# Patient Record
Sex: Male | Born: 1990 | Race: Black or African American | Hispanic: No | Marital: Single | Smoking: Current every day smoker
Health system: Southern US, Community
[De-identification: ages and names within clinical notes are randomized; demographics above are authoritative.]

## PROBLEM LIST (undated history)

## (undated) ENCOUNTER — Ambulatory Visit (HOSPITAL_COMMUNITY): Admission: EM | Payer: Self-pay

## (undated) DIAGNOSIS — B20 Human immunodeficiency virus [HIV] disease: Secondary | ICD-10-CM

## (undated) DIAGNOSIS — Q2730 Arteriovenous malformation, site unspecified: Secondary | ICD-10-CM

## (undated) DIAGNOSIS — F200 Paranoid schizophrenia: Secondary | ICD-10-CM

## (undated) DIAGNOSIS — Z21 Asymptomatic human immunodeficiency virus [HIV] infection status: Secondary | ICD-10-CM

## (undated) DIAGNOSIS — F1910 Other psychoactive substance abuse, uncomplicated: Secondary | ICD-10-CM

---
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
## 2010-11-04 ENCOUNTER — Emergency Department (HOSPITAL_COMMUNITY): Payer: Self-pay

## 2010-11-04 ENCOUNTER — Emergency Department (HOSPITAL_COMMUNITY)
Admission: EM | Admit: 2010-11-04 | Discharge: 2010-11-04 | Disposition: A | Discharge status: Home / Self Care | Payer: Self-pay | Attending: Emergency Medicine | Admitting: Emergency Medicine

## 2010-11-04 DIAGNOSIS — R141 Gas pain: Secondary | ICD-10-CM | POA: Insufficient documentation

## 2010-11-04 DIAGNOSIS — K5900 Constipation, unspecified: Secondary | ICD-10-CM | POA: Insufficient documentation

## 2010-11-04 DIAGNOSIS — R1013 Epigastric pain: Secondary | ICD-10-CM | POA: Insufficient documentation

## 2010-11-04 DIAGNOSIS — R142 Eructation: Secondary | ICD-10-CM | POA: Insufficient documentation

## 2011-05-02 LAB — RAPID STREP SCREEN (MED CTR MEBANE ONLY): Streptococcus, Group A Screen (Direct): NEGATIVE

## 2011-08-22 ENCOUNTER — Encounter (HOSPITAL_COMMUNITY): Payer: Self-pay | Admitting: *Deleted

## 2011-08-22 ENCOUNTER — Emergency Department (HOSPITAL_COMMUNITY)
Admission: EM | Admit: 2011-08-22 | Discharge: 2011-08-22 | Disposition: A | Discharge status: Home / Self Care | Payer: Self-pay | Attending: Emergency Medicine | Admitting: Emergency Medicine

## 2011-08-22 DIAGNOSIS — N342 Other urethritis: Secondary | ICD-10-CM | POA: Insufficient documentation

## 2011-08-22 DIAGNOSIS — R369 Urethral discharge, unspecified: Secondary | ICD-10-CM | POA: Insufficient documentation

## 2011-08-22 MED ORDER — AZITHROMYCIN 250 MG PO TABS
1000.0000 mg | ORAL_TABLET | Freq: Once | ORAL | Status: AC
  Administered 2011-08-22: 1000 mg via ORAL
  Filled 2011-08-22: qty 4

## 2011-08-22 MED ORDER — CEFTRIAXONE SODIUM 250 MG IJ SOLR
250.0000 mg | Freq: Once | INTRAMUSCULAR | Status: AC
  Administered 2011-08-22: 250 mg via INTRAMUSCULAR
  Filled 2011-08-22: qty 250

## 2011-08-22 MED ORDER — LIDOCAINE HCL 1 % IJ SOLN
INTRAMUSCULAR | Status: AC
  Administered 2011-08-22: 20 mL
  Filled 2011-08-22: qty 20

## 2011-08-22 NOTE — ED Provider Notes (Signed)
History     CSN: [id number]  Arrival date & time 08/22/11  8295   First MD Initiated Contact with Patient 08/22/11 2037      Chief Complaint  Patient presents with  . SEXUALLY TRANSMITTED DISEASE    white penile d/c x 3 days w/dysuria    (Consider location/radiation/quality/duration/timing/severity/associated sxs/prior treatment) Patient is a 21 y.o. male presenting with penile discharge. The history is provided by the patient.  Penile Discharge This is a new problem. The current episode started in the past 7 days. The problem occurs constantly. The problem has been unchanged. Pertinent negatives include no abdominal pain, chills, fever, nausea, rash or vomiting. Associated symptoms comments: Positive dysuria. The symptoms are aggravated by nothing. He has tried nothing for the symptoms.  Unprotected anal intercourse with 1 male partner recently, unknown STD status. Has appt at STD clinic next week.  History reviewed. No pertinent past medical history.  History reviewed. No pertinent past surgical history.  History reviewed. No pertinent family history.  History  Substance Use Topics  . Smoking status: Current Everyday Smoker    Types: Cigarettes  . Smokeless tobacco: Not on file  . Alcohol Use: Yes     twice a week      Review of Systems  Constitutional: Negative for fever and chills.  Gastrointestinal: Negative for nausea, vomiting and abdominal pain.  Genitourinary: Positive for dysuria and discharge. Negative for penile swelling and penile pain.  Skin: Negative for rash.    Allergies  Review of patient's allergies indicates no known allergies.  Home Medications  No current outpatient prescriptions on file.  BP 109/76  Pulse 65  Temp(Src) 98.9 F (37.2 C) (Oral)  Resp 16  SpO2 100%  Physical Exam  Nursing note and vitals reviewed. Constitutional: He is oriented to person, place, and time. He appears well-developed and well-nourished. No distress.  HENT:   Head: Normocephalic and atraumatic.  Right Ear: External ear normal.  Left Ear: External ear normal.  Mouth/Throat: Oropharynx is clear and moist.  Eyes: Pupils are equal, round, and reactive to light.  Neck: Normal range of motion. Neck supple.  Cardiovascular: Normal rate and regular rhythm.   Pulmonary/Chest:       Normal respiratory effort and excursion  Abdominal: Soft. He exhibits no distension. There is no tenderness.  Genitourinary: Testes normal. Circumcised. No penile tenderness. Discharge found.       Yellow penile discharge  Musculoskeletal: He exhibits no edema and no tenderness.  Neurological: He is alert and oriented to person, place, and time.  Skin: Skin is warm and dry. No rash noted.  Psychiatric: He has a normal mood and affect.    ED Course  Procedures (including critical care time)   Labs Reviewed  GC/CHLAMYDIA PROBE AMP, GENITAL   No results found.  Dx 1: Urethritis    MDM  Tx with rocephin and azithromycin, high suspicion for gonorrhea +/- chlamydia. Advised to have syphilis and HIV testing from STD clinic.        Elwyn Reach [place], [place] 08/22/11 2150

## 2011-08-23 NOTE — ED Provider Notes (Signed)
Medical screening examination/treatment/procedure(s) were performed by non-physician practitioner and as supervising physician I was immediately available for consultation/collaboration.  Doug Sou, MD 08/23/11 [phone]

## 2011-08-25 LAB — GC/CHLAMYDIA PROBE AMP, GENITAL
Chlamydia, DNA Probe: POSITIVE — AB
GC Probe Amp, Genital: POSITIVE — AB

## 2011-08-28 ENCOUNTER — Telehealth (HOSPITAL_COMMUNITY): Payer: Self-pay | Admitting: *Deleted

## 2011-08-29 ENCOUNTER — Telehealth (HOSPITAL_COMMUNITY): Payer: Self-pay | Admitting: *Deleted

## 2011-08-30 ENCOUNTER — Telehealth (HOSPITAL_COMMUNITY): Payer: Self-pay | Admitting: Emergency Medicine

## 2011-08-30 NOTE — ED Notes (Signed)
Several attempts to contact patient; letter sent to Epic address

## 2011-09-01 NOTE — ED Notes (Signed)
Letter sent to Epic address 1/21 °

## 2011-11-05 ENCOUNTER — Emergency Department (INDEPENDENT_AMBULATORY_CARE_PROVIDER_SITE_OTHER)
Admission: EM | Admit: 2011-11-05 | Discharge: 2011-11-05 | Disposition: A | Discharge status: Home / Self Care | Payer: Self-pay | Source: Home / Self Care | Attending: Family Medicine | Admitting: Family Medicine

## 2011-11-05 ENCOUNTER — Encounter (HOSPITAL_COMMUNITY): Payer: Self-pay

## 2011-11-05 DIAGNOSIS — K2990 Gastroduodenitis, unspecified, without bleeding: Secondary | ICD-10-CM

## 2011-11-05 DIAGNOSIS — K2970 Gastritis, unspecified, without bleeding: Secondary | ICD-10-CM

## 2011-11-05 MED ORDER — RANITIDINE HCL 150 MG PO TABS: 150.0000 mg | ORAL_TABLET | Freq: Two times a day (BID) | ORAL | Status: AC

## 2011-11-05 NOTE — ED Provider Notes (Signed)
History     CSN: [id number]  Arrival date & time 11/05/11  4098   First MD Initiated Contact with Patient 11/05/11 2001      Chief Complaint  Patient presents with  . Abdominal Pain    (Consider location/radiation/quality/duration/timing/severity/associated sxs/prior treatment) HPI Comments: Benjamin Simpson presents for evaluation of lower abdominal pain, intermittently, over the last month. He reports some intermittent epigastric pain as well, that seems exacerbated with certain foods, such as spicy. He denies any hx of GERD or reflux disease. He also noted some blood when he wiped earlier today.  Patient is a 21 y.o. male presenting with abdominal pain. The history is provided by the patient.  Abdominal Pain The primary symptoms of the illness include abdominal pain. The primary symptoms of the illness do not include nausea or vomiting. The current episode started more than 2 days ago. The problem has not changed since onset. The patient has not had a change in bowel habit. Additional symptoms associated with the illness include heartburn.    History reviewed. No pertinent past medical history.  History reviewed. No pertinent past surgical history.  History reviewed. No pertinent family history.  History  Substance Use Topics  . Smoking status: Current Everyday Smoker    Types: Cigarettes  . Smokeless tobacco: Not on file  . Alcohol Use: Yes     twice a week      Review of Systems  Constitutional: Negative.   HENT: Negative.   Eyes: Negative.   Respiratory: Negative.   Cardiovascular: Negative.   Gastrointestinal: Positive for heartburn and abdominal pain. Negative for nausea and vomiting.  Genitourinary: Negative.   Musculoskeletal: Negative.   Skin: Negative.   Neurological: Negative.     Allergies  Review of patient's allergies indicates no known allergies.  Home Medications   Current Outpatient Rx  Name Route Sig Dispense Refill  . RANITIDINE HCL 150 MG PO TABS  Oral Take 1 tablet (150 mg total) by mouth 2 (two) times daily. 30 tablet 0    BP 118/72  Pulse 54  Temp(Src) 98.2 F (36.8 C) (Oral)  Resp 10  SpO2 100%  Physical Exam  Nursing note and vitals reviewed. Constitutional: He is oriented to person, place, and time. He appears well-developed and well-nourished.  HENT:  Head: Normocephalic and atraumatic.  Eyes: EOM are normal.  Neck: Normal range of motion.  Pulmonary/Chest: Effort normal.  Abdominal: Soft. Normal appearance and bowel sounds are normal. There is no tenderness.  Genitourinary: Guaiac negative stool.  Musculoskeletal: Normal range of motion.  Neurological: He is alert and oriented to person, place, and time.  Skin: Skin is warm and dry.  Psychiatric: His behavior is normal.    ED Course  Procedures (including critical care time)  Labs Reviewed - No data to display No results found.   1. Gastritis       MDM  Given rx for ranitidine trial; advised diet modification        Renaee Munda, MD 12/03/11 2123

## 2011-11-05 NOTE — Discharge Instructions (Signed)
Take the medication as directed. Modify diet to reduce amount of acidic products, dairy products, spicy or fried foods. Consume more fruits and vegetables and fiber in your diet. Return to care should your symptoms not improve, or worsen in any way, such as increased abdominal pain, you are unable to tolerate food or drink by mouth, persistent vomiting, or any other symptom that might concern you.

## 2011-11-05 NOTE — ED Notes (Signed)
Pain and pressure in abdominal area for 1 month or more; concerned about multiple issues, but had blood in stool earlier today; NAD

## 2012-03-02 IMAGING — CR DG ABDOMEN ACUTE W/ 1V CHEST
3 series · 3 of 3 positions shown · non-contrast
Comparison: None.

CLINICAL DATA: Abdominal pain.

ACUTE ABDOMEN SERIES (ABDOMEN 2 VIEW & CHEST 1 VIEW)

[w chest pa]
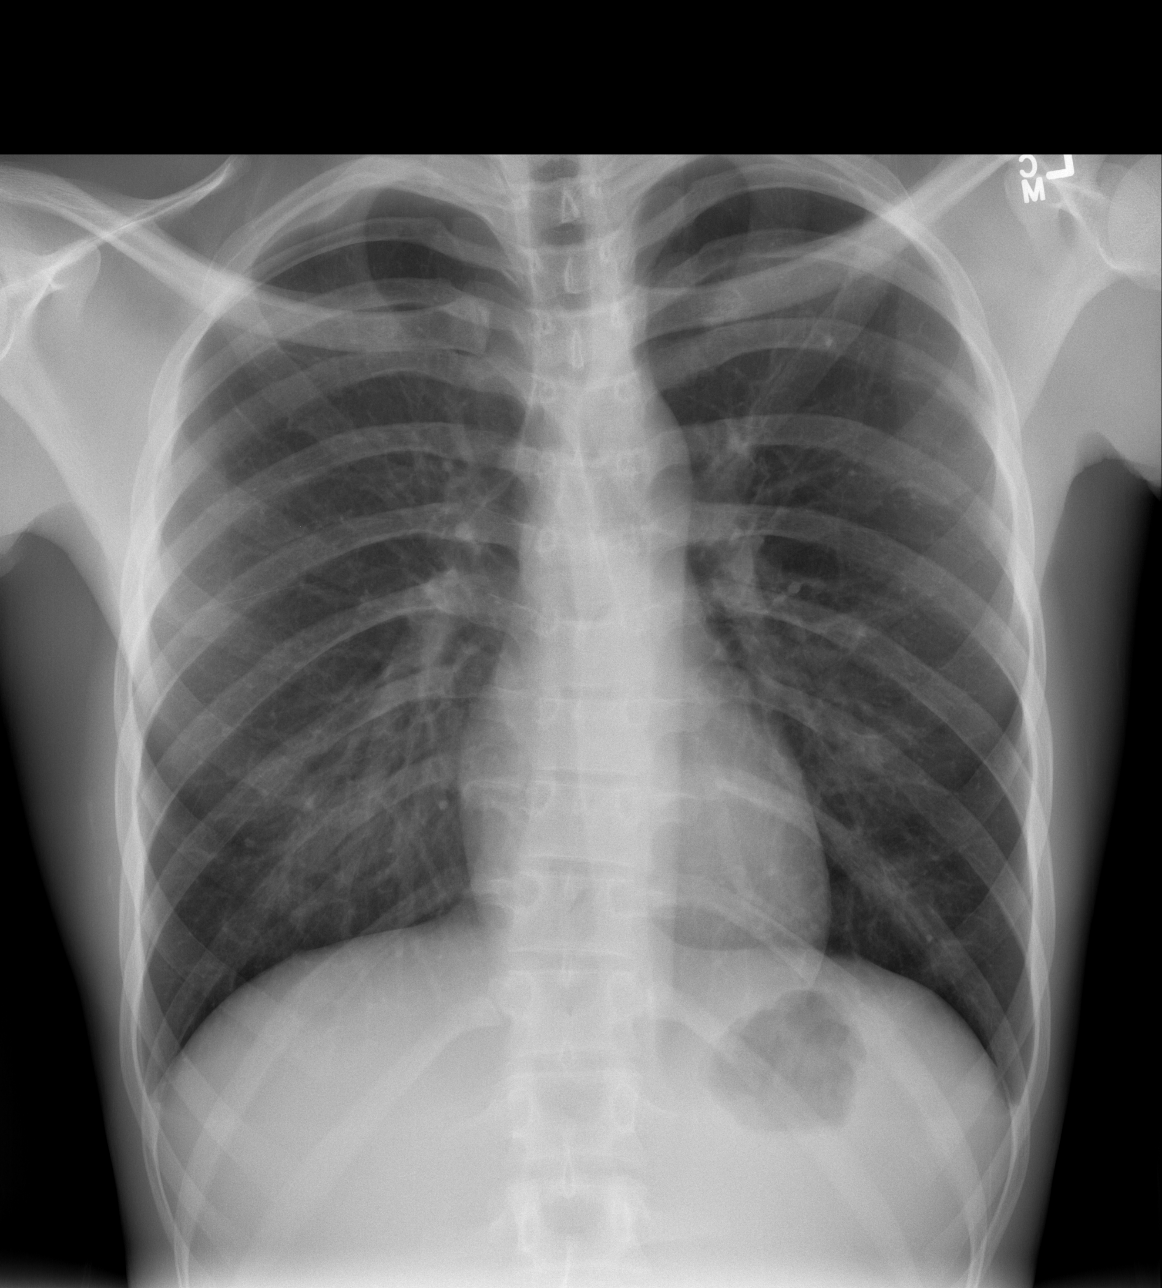

[w abdomen upright]
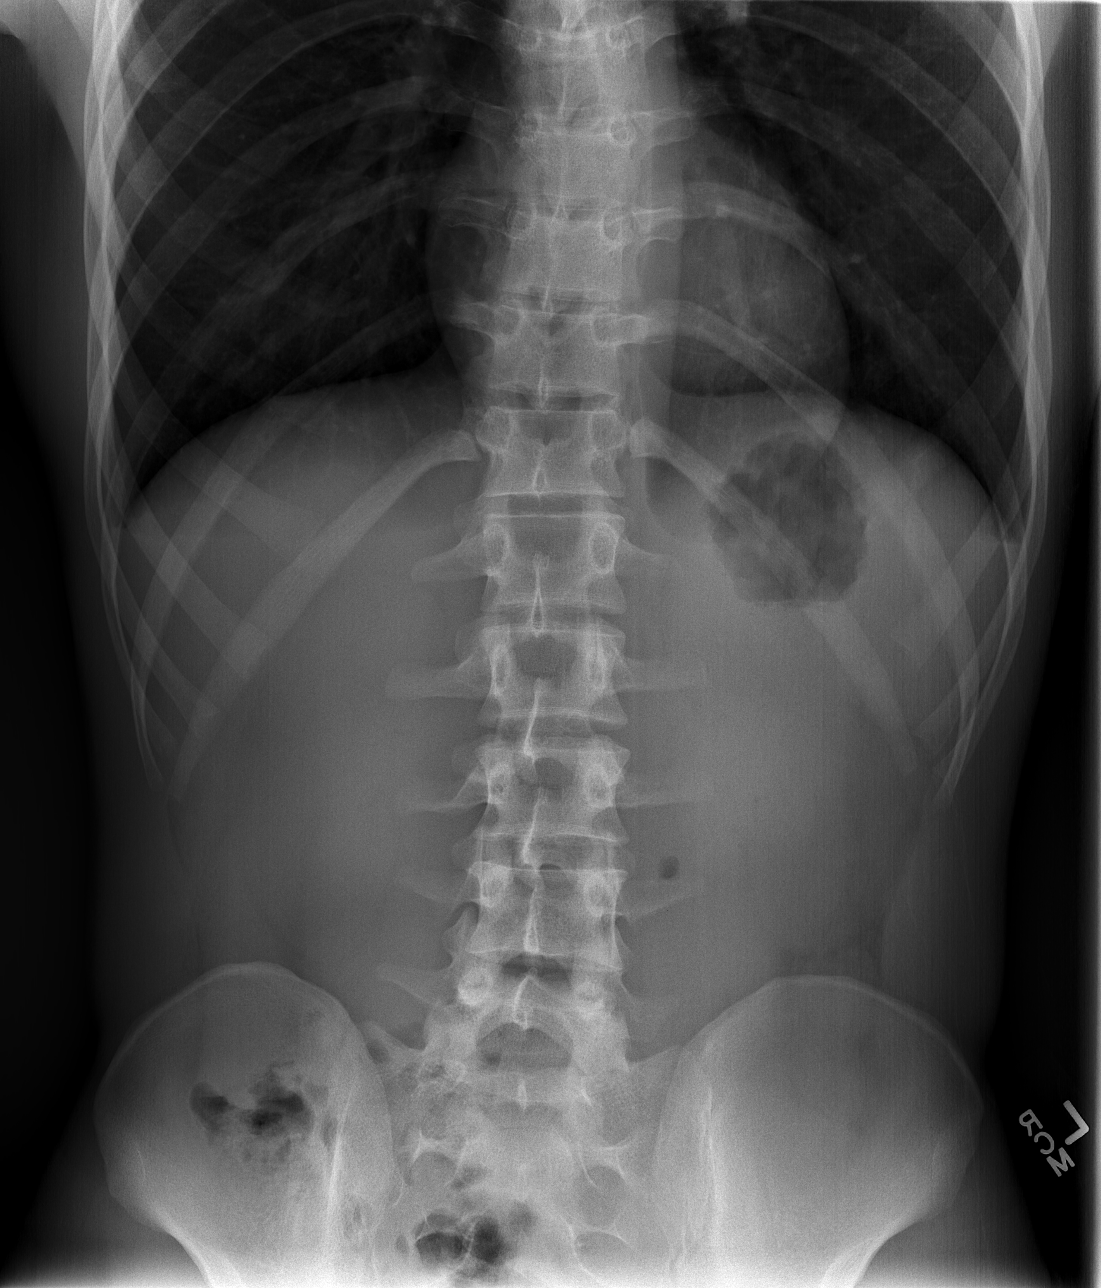

[t abdomen supine]
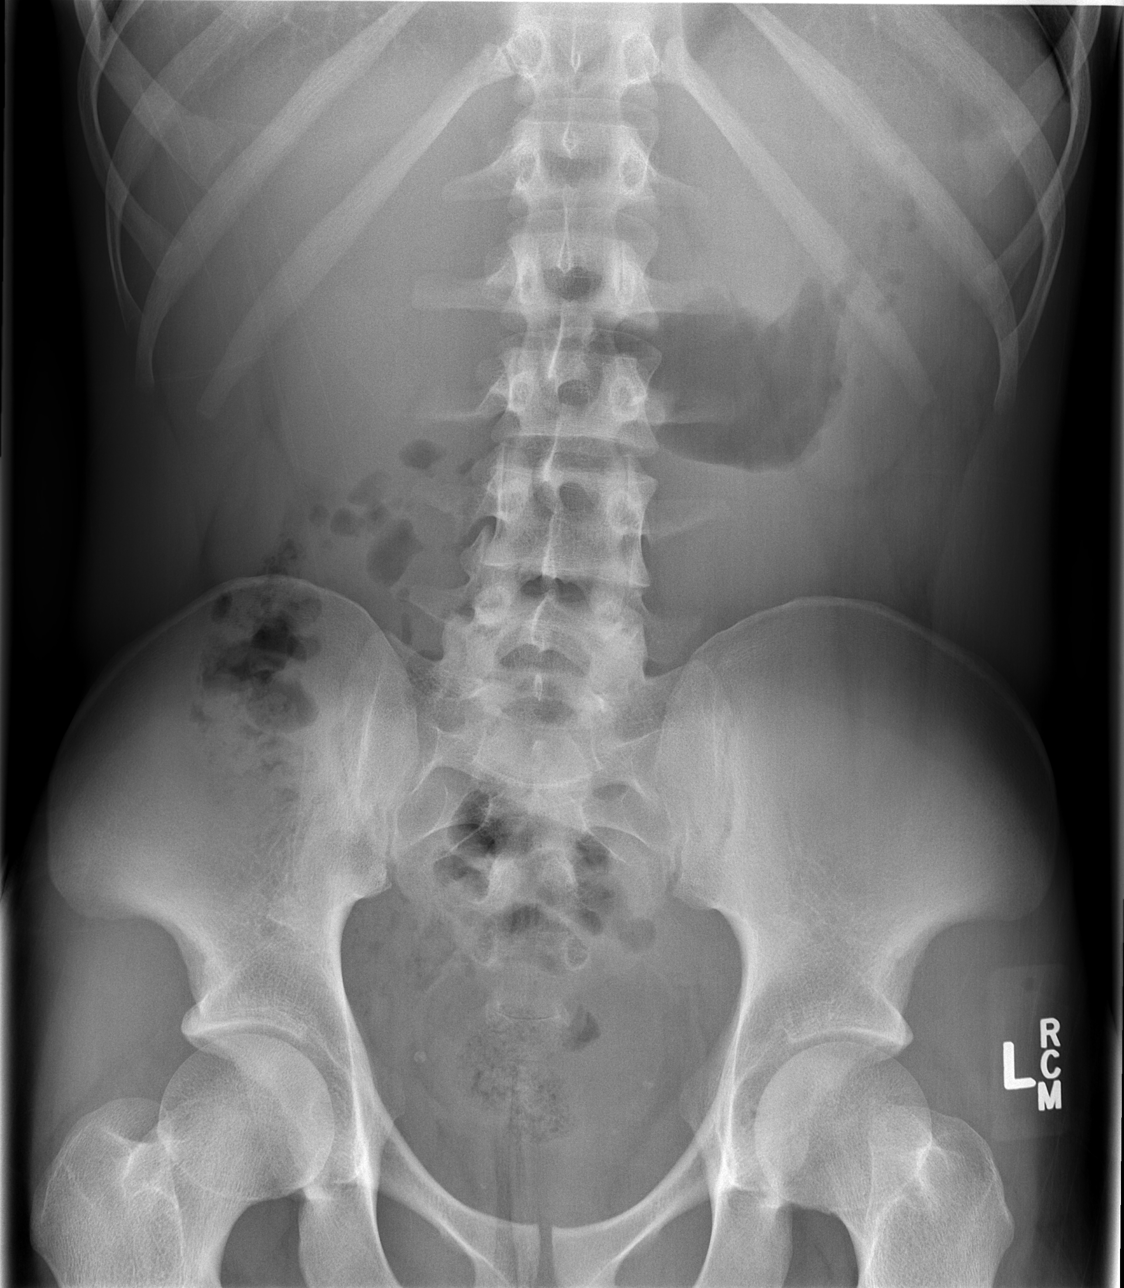

[3 of 3 positions shown; findings below may reference images not displayed]

FINDINGS: The lungs are clear without focal consolidation, edema,
effusion or pneumothorax.  Cardiopericardial silhouette is within
normal limits for size.  Imaged bony structures of the thorax are
intact.

Upright film shows no evidence for intraperitoneal free air. There
is no evidence for gaseous bowel dilation to suggest obstruction.
Visualized bony structures are unremarkable.
IMPRESSION: Normal chest x-ray.

No evidence for intraperitoneal free air or bowel obstruction.

## 2021-01-09 ENCOUNTER — Encounter (HOSPITAL_COMMUNITY): Payer: Self-pay | Admitting: Registered Nurse

## 2021-01-09 ENCOUNTER — Other Ambulatory Visit: Payer: Self-pay

## 2021-01-09 ENCOUNTER — Ambulatory Visit (HOSPITAL_COMMUNITY)
Admission: EM | Admit: 2021-01-09 | Discharge: 2021-01-10 | Disposition: A | Discharge status: Other Acute Inpatient Hospital | Payer: No Payment, Other | Attending: Registered Nurse | Admitting: Registered Nurse

## 2021-01-09 DIAGNOSIS — Z79899 Other long term (current) drug therapy: Secondary | ICD-10-CM | POA: Diagnosis not present

## 2021-01-09 DIAGNOSIS — Z20822 Contact with and (suspected) exposure to covid-19: Secondary | ICD-10-CM | POA: Insufficient documentation

## 2021-01-09 DIAGNOSIS — F200 Paranoid schizophrenia: Secondary | ICD-10-CM | POA: Diagnosis not present

## 2021-01-09 DIAGNOSIS — Z9114 Patient's other noncompliance with medication regimen: Secondary | ICD-10-CM | POA: Insufficient documentation

## 2021-01-09 LAB — POC SARS CORONAVIRUS 2 AG: SARSCOV2ONAVIRUS 2 AG: NEGATIVE

## 2021-01-09 LAB — POCT URINE DRUG SCREEN - MANUAL ENTRY (I-SCREEN)
POC Amphetamine UR: NOT DETECTED
POC Buprenorphine (BUP): NOT DETECTED
POC Cocaine UR: NOT DETECTED
POC Marijuana UR: POSITIVE — AB
POC Methadone UR: NOT DETECTED
POC Methamphetamine UR: NOT DETECTED
POC Morphine: NOT DETECTED
POC Oxazepam (BZO): NOT DETECTED
POC Oxycodone UR: NOT DETECTED
POC Secobarbital (BAR): NOT DETECTED

## 2021-01-09 LAB — RESP PANEL BY RT-PCR (FLU A&B, COVID) ARPGX2
Influenza A by PCR: NEGATIVE
Influenza B by PCR: NEGATIVE
SARS Coronavirus 2 by RT PCR: NEGATIVE

## 2021-01-09 LAB — POC SARS CORONAVIRUS 2 AG -  ED: SARS Coronavirus 2 Ag: NEGATIVE

## 2021-01-09 MED ORDER — LORAZEPAM 1 MG PO TABS: 1.0000 mg | ORAL_TABLET | ORAL | Status: DC | PRN

## 2021-01-09 MED ORDER — OLANZAPINE 10 MG PO TBDP
10.0000 mg | ORAL_TABLET | Freq: Once | ORAL | Status: AC
  Administered 2021-01-09: 10 mg via ORAL
  Filled 2021-01-09: qty 1

## 2021-01-09 MED ORDER — ACETAMINOPHEN 325 MG PO TABS: 650.0000 mg | ORAL_TABLET | Freq: Four times a day (QID) | ORAL | Status: DC | PRN

## 2021-01-09 MED ORDER — ZIPRASIDONE MESYLATE 20 MG IM SOLR
20.0000 mg | Freq: Two times a day (BID) | INTRAMUSCULAR | Status: DC | PRN
Start: 2021-01-09 — End: 2021-01-10

## 2021-01-09 MED ORDER — MAGNESIUM HYDROXIDE 400 MG/5ML PO SUSP: 30.0000 mL | Freq: Every day | ORAL | Status: DC | PRN

## 2021-01-09 MED ORDER — ALUM & MAG HYDROXIDE-SIMETH 200-200-20 MG/5ML PO SUSP: 30.0000 mL | ORAL | Status: DC | PRN
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
## 2021-01-09 NOTE — ED Triage Notes (Signed)
Per report pt IVC'd by mother for non complance with medications and abnormal bx.

## 2021-01-09 NOTE — Discharge Instructions (Addendum)
Discharge to [facility] 

## 2021-01-09 NOTE — ED Notes (Signed)
Benjamin Simpson refused all blood work but allow urine and covid screening

## 2021-01-09 NOTE — BHH Counselor (Signed)
TTS triage: Patient presenting voluntarily to [facility]. He states "They told me to come here for an evaluation." He denies SI/HI/AVH. When asked if there were any precipitating events that led the police to bring him in he states "My mom has been trying to get me out of the house for awhile." Patient denies any recent substance use. Officer advised that his mother is going to Therapist, occupational for IVC.  Patient is routine.

## 2021-01-09 NOTE — ED Provider Notes (Signed)
Behavioral Health Admission [facility] & OBS)  Date: 01/09/21 Patient Name: Benjamin Simpson MRN: [id number] Chief Complaint:  Chief Complaint  Patient presents with  . Urgent Emergent Eval IVC   Chief Complaint/Presenting Problem: Ivann is a 30yo male transported to [facility] by [employer] under IVC.  Pt was petitioned by [name] [phone]).  Diagnoses:  Final diagnoses:  Schizophrenia, paranoid (HCC)    HPI: Benjamin Simpson, 30 y.o., male patient presents to [facility] via law enforcement under IVC petition by patients mother with complaints that patient has history of schizophrenia, off medications, not caring for himself, paranoia, hallucinations, and increased aggression and violence.   Patient seen face to face by this provider, consulted with Dr. Earlene Plater; and chart reviewed on 01/09/21.  On evaluation Benjamin Simpson reports he recently moved here from [place]; then states he just came to visit; then states that he ha a job at the [employer].  Patient states he is visiting his mother and that as soon "as I got her she started acting funny and sending me to the hospital.  I think she just trying to get me out of the house."  Patient asked about violence "I tripped and fell down the stairs and hit my head on the wall and my mama like You out there fighting you need to go to the hospital.  I'm like what for; I just fell."  Patient denies that he has ever been diagnosed with a mental health disorder and denies that he is HIV+.  Patient gave permission to speak to his mother for collateral information.  When informed patient about speaking to his mother when asked who IVC petition was patient stated "I don't know who that is.  That ain't my mother.  My name is Benjamin Simpson."  During evaluation Dezi Schaner is alert/oriented x 4; calm/cooperative; and mood is congruent with affect.  He does not appear to be responding to internal/external stimuli or delusional thoughts; but some  response were a little off.  Patient denies suicidal/self-harm/homicidal ideation, psychosis, and paranoia.    See TTS counselor not for collateral information details.     PHQ 2-9:     Total Time spent with patient: 45 minutes  Musculoskeletal  Strength & Muscle Tone: within normal limits Gait & Station: normal Patient leans: N/A  Psychiatric Specialty Exam  Presentation General Appearance: Appropriate for Environment; Casual  Eye Contact:Good  Speech:Clear and Coherent; Normal Rate  Speech Volume:Normal  Handedness:Right   Mood and Affect  Mood:Dysphoric  Affect:Appropriate   Thought Process  Thought Processes:Coherent  Descriptions of Associations:Tangential  Orientation:Full (Time, Place and Person)  Thought Content:WDL    Hallucinations:Hallucinations: None  Ideas of Reference:No data recorded Suicidal Thoughts:Suicidal Thoughts: No  Homicidal Thoughts:Homicidal Thoughts: No   Sensorium  Memory:Immediate Good; Recent Good  Judgment:Fair  Insight:Fair; Present   Executive Functions  Concentration:Good  Attention Span:Good  Recall:Good  Fund of Knowledge:Good  Language:Good   Psychomotor Activity  Psychomotor Activity:Psychomotor Activity: Normal   Assets  Assets:Communication Skills; Desire for Improvement; Housing; Leisure Time; Social Support   Sleep  Sleep:Sleep: Fair   Nutritional Assessment (For OBS and [facility] admissions only) Has the patient had a weight loss or gain of 10 pounds or more in the last 3 months?: No Has the patient had a decrease in food intake/or appetite?: No Does the patient have dental problems?: No Does the patient have eating habits or behaviors that may be indicators of an eating disorder including binging or inducing vomiting?:  No Has the patient recently lost weight without trying?: No Has the patient been eating poorly because of a decreased appetite?: No Malnutrition Screening Tool Score:  0    Physical Exam Vitals and nursing note reviewed. Exam conducted with a chaperone present.  Constitutional:      General: He is not in acute distress.    Appearance: Normal appearance. He is not ill-appearing.  HENT:     Head: Normocephalic.  Cardiovascular:     Rate and Rhythm: Normal rate.  Pulmonary:     Effort: Pulmonary effort is normal.  Musculoskeletal:        General: Normal range of motion.     Cervical back: Normal range of motion.  Skin:    General: Skin is warm and dry.  Neurological:     Mental Status: He is alert and oriented to person, place, and time.  Psychiatric:        Attention and Perception: Attention and perception normal. He does not perceive auditory or visual hallucinations.        Mood and Affect: Mood and affect normal.        Speech: Speech normal.        Behavior: Behavior normal. Behavior is cooperative.        Thought Content: Thought content normal. Thought content is not paranoid or delusional. Thought content does not include homicidal or suicidal ideation.        Cognition and Memory: Cognition and memory normal.        Judgment: Judgment normal.    Review of Systems  Constitutional: Negative.   HENT: Negative.   Eyes: Negative.   Respiratory: Negative.   Cardiovascular: Negative.   Gastrointestinal: Negative.   Genitourinary: Negative.   Musculoskeletal: Negative.   Skin: Negative.   Neurological: Negative.   Endo/Heme/Allergies: Negative.   Psychiatric/Behavioral: Negative for depression, hallucinations, memory loss, substance abuse and suicidal ideas. The patient is not nervous/anxious and does not have insomnia.        Patient denies all.      Blood pressure 106/68, pulse (!) 110, temperature 99.4 F (37.4 C), temperature source Oral, resp. rate 16, SpO2 97 %. There is no height or weight on file to calculate BMI.  Past Psychiatric History: Schizophrenia    Is the patient at risk to self? No  Has the patient been a risk  to self in the past 6 months? No .    Has the patient been a risk to self within the distant past? No   Is the patient a risk to others? No   Has the patient been a risk to others in the past 6 months? No   Has the patient been a risk to others within the distant past? No   Past Medical History: History reviewed. No pertinent past medical history. History reviewed. No pertinent surgical history.  Family History: History reviewed. No pertinent family history.  Social History:  Social History   Socioeconomic History  . Marital status: Single    Spouse name: Not on file  . Number of children: Not on file  . Years of education: Not on file  . Highest education level: Not on file  Occupational History  . Not on file  Tobacco Use  . Smoking status: Not on file  . Smokeless tobacco: Not on file  Substance and Sexual Activity  . Alcohol use: Not on file  . Drug use: Not on file  . Sexual activity: Not on file  Other  Topics Concern  . Not on file  Social History Narrative  . Not on file   Social Determinants of Health   Financial Resource Strain: Not on file  Food Insecurity: Not on file  Transportation Needs: Not on file  Physical Activity: Not on file  Stress: Not on file  Social Connections: Not on file  Intimate Partner Violence: Not on file    SDOH:  SDOH Screenings   Alcohol Screen: Not on file  Depression (PHQ2-9): Not on file  Financial Resource Strain: Not on file  Food Insecurity: Not on file  Housing: Not on file  Physical Activity: Not on file  Social Connections: Not on file  Stress: Not on file  Tobacco Use: Not on file  Transportation Needs: Not on file    Last Labs:  No results found for any previous visit.    Allergies: Patient has no allergy information on record.  PTA Medications: (Not in a hospital admission)   Medical Decision Making  Patient admitted to Continuous Assessment unit for safety and stabilization.  Monitor overnight and  reassess tomorrow morning for possible discharge.  Referral for outpatient psychiatric services and primary care/infections disease to follow up with related to HIV.     Lab Orders     Resp Panel by RT-PCR (Flu A&B, Covid) Nasopharyngeal Swab     CBC with Differential/Platelet     Comprehensive metabolic panel     Hemoglobin X4V     Magnesium     Lipid panel     TSH     Urinalysis, Routine w reflex microscopic Urine, Clean Catch     POC SARS Coronavirus 2 Ag-ED - Nasal Swab     POCT Urine Drug Screen - (ICup)   Medication management . OLANZapine zydis  10 mg Oral Once      Recommendations  Based on my evaluation the patient does not appear to have an emergency medical condition.  Vanesha Athens, NP 01/09/21  6:53 PM

## 2021-01-10 ENCOUNTER — Encounter (HOSPITAL_COMMUNITY): Payer: Self-pay | Admitting: Psychiatry

## 2021-01-10 ENCOUNTER — Inpatient Hospital Stay (HOSPITAL_COMMUNITY)
Admission: AD | Admit: 2021-01-10 | Discharge: 2021-01-16 | DRG: 885 | Disposition: A | Discharge status: Home / Self Care | Payer: No Typology Code available for payment source | Source: Intra-hospital | Attending: Psychiatry | Admitting: Psychiatry

## 2021-01-10 ENCOUNTER — Other Ambulatory Visit: Payer: Self-pay

## 2021-01-10 ENCOUNTER — Emergency Department (HOSPITAL_COMMUNITY)
Admission: EM | Admit: 2021-01-10 | Discharge: 2021-01-10 | Disposition: A | Discharge status: Other Acute Inpatient Hospital | Payer: Self-pay | Attending: Emergency Medicine | Admitting: Emergency Medicine

## 2021-01-10 DIAGNOSIS — F17210 Nicotine dependence, cigarettes, uncomplicated: Secondary | ICD-10-CM | POA: Insufficient documentation

## 2021-01-10 DIAGNOSIS — Z9114 Patient's other noncompliance with medication regimen: Secondary | ICD-10-CM | POA: Diagnosis not present

## 2021-01-10 DIAGNOSIS — G4700 Insomnia, unspecified: Secondary | ICD-10-CM | POA: Diagnosis present

## 2021-01-10 DIAGNOSIS — Z20822 Contact with and (suspected) exposure to covid-19: Secondary | ICD-10-CM | POA: Diagnosis present

## 2021-01-10 DIAGNOSIS — F200 Paranoid schizophrenia: Principal | ICD-10-CM | POA: Diagnosis present

## 2021-01-10 DIAGNOSIS — Z79899 Other long term (current) drug therapy: Secondary | ICD-10-CM | POA: Diagnosis not present

## 2021-01-10 DIAGNOSIS — B20 Human immunodeficiency virus [HIV] disease: Secondary | ICD-10-CM | POA: Diagnosis present

## 2021-01-10 DIAGNOSIS — F22 Delusional disorders: Secondary | ICD-10-CM | POA: Insufficient documentation

## 2021-01-10 LAB — URINALYSIS, ROUTINE W REFLEX MICROSCOPIC
Bilirubin Urine: NEGATIVE
Bilirubin Urine: NEGATIVE
Glucose, UA: NEGATIVE mg/dL
Glucose, UA: NEGATIVE mg/dL
Hgb urine dipstick: NEGATIVE
Hgb urine dipstick: NEGATIVE
Ketones, ur: NEGATIVE mg/dL
Ketones, ur: NEGATIVE mg/dL
Leukocytes,Ua: NEGATIVE
Leukocytes,Ua: NEGATIVE
Nitrite: NEGATIVE
Nitrite: NEGATIVE
Protein, ur: NEGATIVE mg/dL
Protein, ur: NEGATIVE mg/dL
Specific Gravity, Urine: 1.005 (ref 1.005–1.030)
Specific Gravity, Urine: 1.019 (ref 1.005–1.030)
pH: 6 (ref 5.0–8.0)
pH: 5 (ref 5.0–8.0)

## 2021-01-10 LAB — CBC WITH DIFFERENTIAL/PLATELET
Abs Immature Granulocytes: 0.01 10*3/uL (ref 0.00–0.07)
Basophils Absolute: 0 10*3/uL (ref 0.0–0.1)
Basophils Relative: 1 %
Eosinophils Absolute: 0.1 10*3/uL (ref 0.0–0.5)
Eosinophils Relative: 1 %
HCT: 46.9 % (ref 39.0–52.0)
Hemoglobin: 15.1 g/dL (ref 13.0–17.0)
Immature Granulocytes: 0 %
Lymphocytes Relative: 27 %
Lymphs Abs: 1.3 10*3/uL (ref 0.7–4.0)
MCH: 32.1 pg (ref 26.0–34.0)
MCHC: 32.2 g/dL (ref 30.0–36.0)
MCV: 99.8 fL (ref 80.0–100.0)
Monocytes Absolute: 0.5 10*3/uL (ref 0.1–1.0)
Monocytes Relative: 11 %
Neutro Abs: 3 10*3/uL (ref 1.7–7.7)
Neutrophils Relative %: 60 %
Platelets: 242 10*3/uL (ref 150–400)
RBC: 4.7 MIL/uL (ref 4.22–5.81)
RDW: 12.9 % (ref 11.5–15.5)
WBC: 4.9 10*3/uL (ref 4.0–10.5)
nRBC: 0 % (ref 0.0–0.2)

## 2021-01-10 LAB — RESP PANEL BY RT-PCR (FLU A&B, COVID) ARPGX2
Influenza A by PCR: NEGATIVE
Influenza B by PCR: NEGATIVE
SARS Coronavirus 2 by RT PCR: NEGATIVE

## 2021-01-10 LAB — COMPREHENSIVE METABOLIC PANEL
ALT: 22 U/L (ref 0–44)
AST: 27 U/L (ref 15–41)
Albumin: 4.2 g/dL (ref 3.5–5.0)
Alkaline Phosphatase: 52 U/L (ref 38–126)
Anion gap: 9 (ref 5–15)
BUN: 10 mg/dL (ref 6–20)
CO2: 26 mmol/L (ref 22–32)
Calcium: 9.7 mg/dL (ref 8.9–10.3)
Chloride: 104 mmol/L (ref 98–111)
Creatinine, Ser: 1.06 mg/dL (ref 0.61–1.24)
GFR, Estimated: >60 mL/min (ref >60)
Glucose, Bld: 110 mg/dL — ABNORMAL HIGH (ref 70–99)
Potassium: 4.5 mmol/L (ref 3.5–5.1)
Sodium: 139 mmol/L (ref 135–145)
Total Bilirubin: 1.6 mg/dL — ABNORMAL HIGH (ref 0.3–1.2)
Total Protein: 7.5 g/dL (ref 6.5–8.1)

## 2021-01-10 LAB — RAPID URINE DRUG SCREEN, HOSP PERFORMED
Amphetamines: NOT DETECTED
Barbiturates: NOT DETECTED
Benzodiazepines: NOT DETECTED
Cocaine: NOT DETECTED
Opiates: NOT DETECTED
Tetrahydrocannabinol: POSITIVE — AB

## 2021-01-10 LAB — ETHANOL: Alcohol, Ethyl (B): <10 mg/dL (ref <10)

## 2021-01-10 MED ORDER — LORAZEPAM 1 MG PO TABS: 1.0000 mg | ORAL_TABLET | ORAL | Status: DC | PRN

## 2021-01-10 MED ORDER — TRAZODONE HCL 50 MG PO TABS
50.0000 mg | ORAL_TABLET | Freq: Every evening | ORAL | Status: DC | PRN
  Administered 2021-01-10 – 2021-01-11 (×2): 50 mg via ORAL
  Filled 2021-01-10 (×2): qty 1

## 2021-01-10 MED ORDER — ACETAMINOPHEN 325 MG PO TABS: 650.0000 mg | ORAL_TABLET | Freq: Four times a day (QID) | ORAL | Status: DC | PRN

## 2021-01-10 MED ORDER — HYDROXYZINE HCL 25 MG PO TABS
25.0000 mg | ORAL_TABLET | Freq: Three times a day (TID) | ORAL | Status: DC | PRN
  Administered 2021-01-10 – 2021-01-11 (×2): 25 mg via ORAL
  Filled 2021-01-10 (×3): qty 1

## 2021-01-10 MED ORDER — OLANZAPINE 10 MG PO TBDP: 10.0000 mg | ORAL_TABLET | Freq: Every day | ORAL | Status: DC

## 2021-01-10 MED ORDER — OLANZAPINE 10 MG PO TBDP
10.0000 mg | ORAL_TABLET | Freq: Every day | ORAL | Status: DC
  Administered 2021-01-10 – 2021-01-14 (×5): 10 mg via ORAL
  Filled 2021-01-10 (×9): qty 1

## 2021-01-10 MED ORDER — OLANZAPINE 5 MG PO TABS
5.0000 mg | ORAL_TABLET | Freq: Every day | ORAL | Status: DC
  Administered 2021-01-11 – 2021-01-15 (×5): 5 mg via ORAL
  Filled 2021-01-10: qty 2
  Filled 2021-01-10 (×7): qty 1

## 2021-01-10 MED ORDER — OLANZAPINE 5 MG PO TBDP: 5.0000 mg | ORAL_TABLET | Freq: Three times a day (TID) | ORAL | Status: DC | PRN

## 2021-01-10 MED ORDER — OLANZAPINE 5 MG PO TABS
5.0000 mg | ORAL_TABLET | Freq: Every day | ORAL | Status: DC
  Administered 2021-01-10: 5 mg via ORAL
  Filled 2021-01-10: qty 1

## 2021-01-10 MED ORDER — ZIPRASIDONE MESYLATE 20 MG IM SOLR: 20.0000 mg | INTRAMUSCULAR | Status: DC | PRN

## 2021-01-10 MED ORDER — MAGNESIUM HYDROXIDE 400 MG/5ML PO SUSP: 30.0000 mL | Freq: Every day | ORAL | Status: DC | PRN

## 2021-01-10 MED ORDER — ALUM & MAG HYDROXIDE-SIMETH 200-200-20 MG/5ML PO SUSP: 30.0000 mL | ORAL | Status: DC | PRN

## 2021-01-10 MED ORDER — OLANZAPINE 5 MG PO TABS: 5.0000 mg | ORAL_TABLET | Freq: Every day | ORAL | Status: DC

## 2021-01-10 NOTE — ED Notes (Signed)
Pt remains asleep no distress noted resp 14 to 16 and easy .

## 2021-01-10 NOTE — ED Notes (Signed)
2 officers at bedside with pt.

## 2021-01-10 NOTE — ED Provider Notes (Signed)
[facility] EMERGENCY DEPARTMENT Provider Note   CSN: [id number] Arrival date & time: 01/10/21  1658     History No chief complaint on file.   Benjamin Simpson is a 30 y.o. male.  Pt presents to the ED today with the police and IVC papers from [facility].  Pt has a bed at [facility], but he needs to be medically cleared first.  Pt has a hx of schizophrenia and actually presented to [facility] yesterday.  His mom completed papers because he's been very paranoid and has not been taking care of himself.         No past medical history on file.  There are no problems to display for this patient.   No past surgical history on file.     No family history on file.  Social History   Tobacco Use  . Smoking status: Current Every Day Smoker    Types: Cigarettes  Substance Use Topics  . Alcohol use: Yes    Comment: twice a week  . Drug use: Yes    Types: Marijuana    Home Medications Prior to Admission medications   Not on File    Allergies    Patient has no known allergies.  Review of Systems   Review of Systems  Psychiatric/Behavioral:       Paranoia    Physical Exam Updated Vital Signs BP 118/70 (BP Location: Left Arm)   Pulse (!) 58   Temp 98.8 F (37.1 C) (Oral)   Resp 20   SpO2 98%   Physical Exam Vitals and nursing note reviewed.  Constitutional:      Appearance: Normal appearance.  HENT:     Head: Normocephalic and atraumatic.     Right Ear: External ear normal.     Left Ear: External ear normal.     Nose: Nose normal.     Mouth/Throat:     Mouth: Mucous membranes are moist.     Pharynx: Oropharynx is clear.  Eyes:     Extraocular Movements: Extraocular movements intact.     Conjunctiva/sclera: Conjunctivae normal.     Pupils: Pupils are equal, round, and reactive to light.  Cardiovascular:     Rate and Rhythm: Normal rate and regular rhythm.     Pulses: Normal pulses.     Heart sounds: Normal heart sounds.  Pulmonary:     Effort:  Pulmonary effort is normal.     Breath sounds: Normal breath sounds.  Abdominal:     General: Abdomen is flat. Bowel sounds are normal.     Palpations: Abdomen is soft.  Musculoskeletal:        General: Normal range of motion.     Cervical back: Normal range of motion and neck supple.  Skin:    General: Skin is warm.     Capillary Refill: Capillary refill takes less than 2 seconds.  Neurological:     General: No focal deficit present.     Mental Status: He is alert and oriented to person, place, and time.  Psychiatric:        Thought Content: Thought content is paranoid.     ED Results / Procedures / Treatments   Labs (all labs ordered are listed, but only abnormal results are displayed) Labs Reviewed  COMPREHENSIVE METABOLIC PANEL - Abnormal; Notable for the following components:      Result Value   Glucose, Bld 110 (*)    Total Bilirubin 1.6 (*)    All other components  within normal limits  RAPID URINE DRUG SCREEN, HOSP PERFORMED - Abnormal; Notable for the following components:   Tetrahydrocannabinol POSITIVE (*)    All other components within normal limits  RESP PANEL BY RT-PCR (FLU A&B, COVID) ARPGX2  ETHANOL  CBC WITH DIFFERENTIAL/PLATELET  URINALYSIS, ROUTINE W REFLEX MICROSCOPIC    EKG None  Radiology No results found.  Procedures Procedures   Medications Ordered in ED Medications - No data to display  ED Course  I have reviewed the triage vital signs and the nursing notes.  Pertinent labs & imaging results that were available during my care of the patient were reviewed by me and considered in my medical decision making (see chart for details).    MDM Rules/Calculators/A&P                          Pt is medically clear to go to [facility].  The police will take him there.  He is stable for transport.   Final Clinical Impression(s) / ED Diagnoses Final diagnoses:  Paranoia [facility])    Rx / DC Orders ED Discharge Orders    None       Jacalyn Lefevre, MD 01/10/21 [id number]

## 2021-01-10 NOTE — ED Notes (Signed)
Patient transferred to [facility] for lab draw, and then will be transported to [facility] for inpatient treatment. Patient transferred via [employer].

## 2021-01-10 NOTE — ED Triage Notes (Signed)
Pt arrives from [facility] d/t inability of staff to obtain blood work at that facility. Has bed at [facility] but has to have results of lab work prior to arrival there. IVC papers already completed.
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
## 2021-01-10 NOTE — Progress Notes (Signed)
Patient continues to refuse lab draw, stating he has talked to his lawyer and it is not a good idea. This RN has not seen patient  Talk on the telephone at all.

## 2021-01-10 NOTE — Plan of Care (Signed)
Patient has become more cooperative but continues to report that "nothing wrong with me". He stayed in the dayroom for a little while then went to his room. Currently in bed awake and cooperative on approach. Safety monitored as expected.
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
## 2021-01-10 NOTE — Progress Notes (Signed)
Multiple attempts made to draw labs patient continues to refuse.

## 2021-01-10 NOTE — ED Notes (Signed)
Pt asleep with even and unlabored respirations. No distress or discomfort noted. Pt remains safe on the unit. Will continue to monitor. 

## 2021-01-10 NOTE — Progress Notes (Signed)
Psychiatrist talked with patient about lab draws, patient continues to refuse.

## 2021-01-10 NOTE — Tx Team (Signed)
Initial Treatment Plan 01/10/2021 9:22 PM [facility] NOB:[id number]    PATIENT STRESSORS: Financial difficulties Marital or family conflict Medication change or noncompliance   PATIENT STRENGTHS: Ability for insight Communication skills Physical Health Supportive family/friends   PATIENT IDENTIFIED PROBLEMS: Irritability, anger  Family conflicts  Unemployment  Housing  Medication non adherence             DISCHARGE CRITERIA:  Improved stabilization in mood, thinking, and/or behavior Medical problems require only outpatient monitoring Motivation to continue treatment in a less acute level of care Verbal commitment to aftercare and medication compliance  PRELIMINARY DISCHARGE PLAN: Outpatient therapy Participate in family therapy Placement in alternative living arrangements  PATIENT/FAMILY INVOLVEMENT: This treatment plan has been presented to and reviewed with the patient, Benjamin Simpson.  The patient has been given the opportunity to ask questions and make suggestions.  Olin Pia, RN 01/10/2021, 9:22 PM

## 2021-01-10 NOTE — Progress Notes (Signed)
Patient resting in bed, eyes closed, respirations are even and unlabored at this time. Nursing staff will continue to monitor.

## 2021-01-10 NOTE — ED Notes (Signed)
Report was given to Delorse Limber RN at [facility] & officers at bedside do have his belongings & IVC paperwork required. [facility] AC has been in contact with this RN to verify pt gets all labs needed before transport is facilitated.

## 2021-01-10 NOTE — Progress Notes (Signed)
Patient resting with eyes closed, respirations even and unlabored at this time. No objective signs of discomfort at this time. Nursing staff will continue to monitor.

## 2021-01-11 DIAGNOSIS — F200 Paranoid schizophrenia: Principal | ICD-10-CM

## 2021-01-11 LAB — COMPREHENSIVE METABOLIC PANEL
ALT: 20 U/L (ref 0–44)
AST: 22 U/L (ref 15–41)
Albumin: 4.5 g/dL (ref 3.5–5.0)
Alkaline Phosphatase: 55 U/L (ref 38–126)
Anion gap: 9 (ref 5–15)
BUN: 12 mg/dL (ref 6–20)
CO2: 27 mmol/L (ref 22–32)
Calcium: 9.8 mg/dL (ref 8.9–10.3)
Chloride: 103 mmol/L (ref 98–111)
Creatinine, Ser: 1.04 mg/dL (ref 0.61–1.24)
GFR, Estimated: >60 mL/min (ref >60)
Glucose, Bld: 131 mg/dL — ABNORMAL HIGH (ref 70–99)
Potassium: 3.8 mmol/L (ref 3.5–5.1)
Sodium: 139 mmol/L (ref 135–145)
Total Bilirubin: 1.9 mg/dL — ABNORMAL HIGH (ref 0.3–1.2)
Total Protein: 7.9 g/dL (ref 6.5–8.1)

## 2021-01-11 LAB — TSH: TSH: 0.458 u[IU]/mL (ref 0.350–4.500)

## 2021-01-11 MED ORDER — ENSURE ENLIVE PO LIQD
237.0000 mL | Freq: Two times a day (BID) | ORAL | Status: DC
  Administered 2021-01-11 – 2021-01-16 (×7): 237 mL via ORAL
  Filled 2021-01-11 (×14): qty 237

## 2021-01-11 MED ORDER — OLANZAPINE 10 MG PO TBDP: 10.0000 mg | ORAL_TABLET | Freq: Three times a day (TID) | ORAL | Status: DC | PRN

## 2021-01-11 MED ORDER — ZIPRASIDONE MESYLATE 20 MG IM SOLR: 20.0000 mg | Freq: Four times a day (QID) | INTRAMUSCULAR | Status: DC | PRN

## 2021-01-11 MED ORDER — LORAZEPAM 1 MG PO TABS: 1.0000 mg | ORAL_TABLET | Freq: Four times a day (QID) | ORAL | Status: DC | PRN

## 2021-01-11 NOTE — Progress Notes (Signed)
Recreation Therapy Notes  INPATIENT RECREATION THERAPY ASSESSMENT  Patient Details Name: Benjamin Simpson MRN: [id number] DOB: 27-Apr-1991 Today's Date: 01/11/2021       Information Obtained From: Patient  Able to Participate in Assessment/Interview: Yes  Patient Presentation: Alert  Reason for Admission (Per Patient): Other (Comments) (Pt stated he came here to see if he really needs to be here.)  Patient Stressors:  (None)  Coping Skills:   [employer] Immunologist  Leisure Interests (2+):  Exercise - [employer] - Listen  Frequency of Recreation/Participation: Other (Comment) (Daily)  Awareness of Community Resources:  Yes  Community Resources:  Psychologist, prison and probation services (Comment) (Stores)  Current Use: Yes  If no, Barriers?:    Expressed Interest in [employer] Information: No  [employer] of Residence:  [place], [place]  Patient Main Form of Transportation: Therapist, music  Patient Strengths:  Wise, Persistant, Determined; Integrity  Patient Identified Areas of Improvement:  Learn to stay away from dangerous situations; Stop giving people the benefit of doubt when they show me who they are.  Patient Goal for Hospitalization:  "to get out of here"  Current SI (including self-harm):  No  Current HI:  No  Current AVH: No  Staff Intervention Plan: Group Attendance,Collaborate with Interdisciplinary Treatment Team  Consent to Intern Participation: N/A    Caroll Rancher, LRT/CTRS  Caroll Rancher A 01/11/2021, 12:59 PM

## 2021-01-11 NOTE — Progress Notes (Signed)
Pt has stayed in his room much of the evening. Pt given PRN Trazodone and Vistaril with HS medications    01/11/21 2000  Psych Admission Type (Psych Patients Only)  Admission Status Involuntary  Psychosocial Assessment  Patient Complaints Anxiety;Irritability  Eye Contact Brief  Facial Expression Angry  Affect Angry;Irritable  Speech Argumentative  Interaction Arrogant;Guarded  Motor Activity Other (Comment) (WNL)  Appearance/Hygiene Disheveled;Poor hygiene  Behavior Characteristics Anxious;Guarded  Mood Suspicious;Preoccupied  Thought Process  Coherency Circumstantial;Loose associations;Tangential  Content Paranoia;Blaming others;Preoccupation  Delusions Paranoid  Perception WDL  Hallucination None reported or observed  Judgment Poor  Confusion WDL  Danger to Self  Current suicidal ideation? Denies  Danger to Others  Danger to Others None reported or observed

## 2021-01-11 NOTE — Progress Notes (Signed)
Recreation Therapy Notes  Date: 6.3.22 Time: 1000 Location: 500 Hall Dayroom  Group Topic: Communication, Team Building, Problem Solving  Goal Area(s) Addresses:  Patient will effectively work with peer towards shared goal.  Patient will identify skills used to make activity successful.  Patient will identify how skills used during activity can be applied to reach post d/c goals.   Intervention: STEM Activity- Pipe Cleaner Tower  Activity: Tallest Free-Standing Tower. In teams of 5-6, patients were given 11 craft pipe cleaners. Using the materials provided, patients were instructed to compete again the opposing team(s) to build the tallest free-standing structure from floor level. The activity was timed; difficulty increased by writer as team construction continued.  Systematically resources were removed with additional directions for example, placing one arm behind their back, working in silence, and shape stipulations. LRT facilitated post-activity discussion reviewing team processes and necessary communication skills involved in completion. Patients were encouraged to reflect how the skills utilized, or not utilized, in this activity can be incorporated to positively impact support systems post discharge.  Education: Social Skills, Decision Making, Discharge Planning   Education Outcome: Acknowledges education/In group clarification offered/Needs additional education.   Clinical Observations/Feedback: Pt did not attended group session.   Benjamin Simpson, LRT/CTRS         Benjamin Simpson 01/11/2021 12:52 PM 

## 2021-01-11 NOTE — [facility] Group Notes (Signed)
  Patient did not attend group.  SPIRITUALITY GROUP NOTE   Spirituality group facilitated by Chaplain Katy Marquail Bradwell, BCC.   Group Description: Group focused on topic of hope. Patients participated in facilitated discussion around topic, connecting with one another around experiences and definitions for hope. Group members engaged with visual explorer photos, reflecting on what hope looks like for them today. Group engaged in discussion around how their definitions of hope are present today in hospital.   Modalities: Psycho-social ed, Adlerian, Narrative, MI    

## 2021-01-11 NOTE — Progress Notes (Signed)
Pt visible in room majority of this shift. Visible in hall / dayroom at brief intervals with blunted affect for medications and meals "I don't know why y'all giving me these medications for. I don't need to be here". Presents paranoid, suspicious, questioning during assessment. Denies SI, HI, AVH and pain but remains guarded. You guys need to let me go and not listen to her" referring to his mother.   All medications given as ordered with verbal education and effects monitored. Q 15 minutes safety checks maintained without incident. Support and encouragement offered to pt this shift. Pt has been medication compliant without adverse drug reactions thus far. Tolerates meals and fluids well. Denies concerns at this time.

## 2021-01-11 NOTE — Tx Team (Signed)
Interdisciplinary Treatment and Diagnostic Plan Update  01/11/2021 Time of Session:  Benjamin Simpson MRN: [id number]  Principal Diagnosis: Schizophrenia, paranoid [facility])  Secondary Diagnoses: Principal Problem:   Schizophrenia, paranoid ([place]) Active Problems:   Paranoid schizophrenia ([place])   Current Medications:  Current Facility-Administered Medications  Medication Dose Route Frequency Provider Last Rate Last Admin  . acetaminophen (TYLENOL) tablet 650 mg  650 mg Oral Q6H PRN Chalmers Guest, NP      . alum & mag hydroxide-simeth (MAALOX/MYLANTA) 200-200-20 MG/5ML suspension 30 mL  30 mL Oral Q4H PRN Chalmers Guest, NP      . feeding supplement (ENSURE ENLIVE / ENSURE PLUS) liquid 237 mL  237 mL Oral BID BM Sharma Covert, MD      . hydrOXYzine (ATARAX/VISTARIL) tablet 25 mg  25 mg Oral TID PRN Chalmers Guest, NP   25 mg at 01/10/21 2121  . OLANZapine zydis (ZYPREXA) disintegrating tablet 10 mg  10 mg Oral Q8H PRN Sharma Covert, MD       And  . LORazepam (ATIVAN) tablet 1 mg  1 mg Oral Q6H PRN Sharma Covert, MD       And  . ziprasidone (GEODON) injection 20 mg  20 mg Intramuscular Q6H PRN Sharma Covert, MD      . magnesium hydroxide (MILK OF MAGNESIA) suspension 30 mL  30 mL Oral Daily PRN Chalmers Guest, NP      . OLANZapine (ZYPREXA) tablet 5 mg  5 mg Oral Daily Chalmers Guest, NP   5 mg at 01/11/21 6237  . OLANZapine zydis (ZYPREXA) disintegrating tablet 10 mg  10 mg Oral QHS Chalmers Guest, NP   10 mg at 01/10/21 2121  . traZODone (DESYREL) tablet 50 mg  50 mg Oral QHS PRN Chalmers Guest, NP   50 mg at 01/10/21 2121   PTA Medications: Medications Prior to Admission  Medication Sig Dispense Refill Last Dose  . OLANZapine (ZYPREXA) 5 MG tablet Take 1 tablet (5 mg total) by mouth daily.     [name] OLANZapine zydis (ZYPREXA) 10 MG disintegrating tablet Take 1 tablet (10 mg total) by mouth at bedtime.       Patient Stressors: Financial difficulties Marital or  family conflict Medication change or noncompliance  Patient Strengths: Ability for insight Communication skills Physical Health Supportive family/friends  Treatment Modalities: Medication Management, Group therapy, Case management,  1 to 1 session with clinician, Psychoeducation, Recreational therapy.   Physician Treatment Plan for Primary Diagnosis: Schizophrenia, paranoid ([place]) Long Term Goal(s): Improvement in symptoms so as ready for discharge Improvement in symptoms so as ready for discharge   Short Term Goals: Ability to identify changes in lifestyle to reduce recurrence of condition will improve Ability to verbalize feelings will improve Ability to demonstrate self-control will improve Ability to identify and develop effective coping behaviors will improve Ability to maintain clinical measurements within normal limits will improve Compliance with prescribed medications will improve Ability to identify triggers associated with substance abuse/mental health issues will improve Ability to identify changes in lifestyle to reduce recurrence of condition will improve Ability to verbalize feelings will improve Ability to demonstrate self-control will improve Ability to identify and develop effective coping behaviors will improve Ability to maintain clinical measurements within normal limits will improve Compliance with prescribed medications will improve Ability to identify triggers associated with substance abuse/mental health issues will improve  Medication Management: Evaluate patient's response, side effects, and tolerance of medication regimen.  Therapeutic Interventions: 1 to 1 sessions, Unit Group sessions and Medication administration.  Evaluation of Outcomes: Not Met  Physician Treatment Plan for Secondary Diagnosis: Principal Problem:   Schizophrenia, paranoid ([place]) Active Problems:   Paranoid schizophrenia ([place])  Long Term Goal(s): Improvement in symptoms so as  ready for discharge Improvement in symptoms so as ready for discharge   Short Term Goals: Ability to identify changes in lifestyle to reduce recurrence of condition will improve Ability to verbalize feelings will improve Ability to demonstrate self-control will improve Ability to identify and develop effective coping behaviors will improve Ability to maintain clinical measurements within normal limits will improve Compliance with prescribed medications will improve Ability to identify triggers associated with substance abuse/mental health issues will improve Ability to identify changes in lifestyle to reduce recurrence of condition will improve Ability to verbalize feelings will improve Ability to demonstrate self-control will improve Ability to identify and develop effective coping behaviors will improve Ability to maintain clinical measurements within normal limits will improve Compliance with prescribed medications will improve Ability to identify triggers associated with substance abuse/mental health issues will improve     Medication Management: Evaluate patient's response, side effects, and tolerance of medication regimen.  Therapeutic Interventions: 1 to 1 sessions, Unit Group sessions and Medication administration.  Evaluation of Outcomes: Not Met   RN Treatment Plan for Primary Diagnosis: Schizophrenia, paranoid ([place]) Long Term Goal(s): Knowledge of disease and therapeutic regimen to maintain health will improve  Short Term Goals: Ability to demonstrate self-control, Ability to participate in decision making will improve and Ability to verbalize feelings will improve  Medication Management: RN will administer medications as ordered by provider, will assess and evaluate patient's response and provide education to patient for prescribed medication. RN will report any adverse and/or side effects to prescribing provider.  Therapeutic Interventions: 1 on 1 counseling sessions,  Psychoeducation, Medication administration, Evaluate responses to treatment, Monitor vital signs and CBGs as ordered, Perform/monitor CIWA, COWS, AIMS and Fall Risk screenings as ordered, Perform wound care treatments as ordered.  Evaluation of Outcomes: Not Met   LCSW Treatment Plan for Primary Diagnosis: Schizophrenia, paranoid ([place]) Long Term Goal(s): Safe transition to appropriate next level of care at discharge, Engage patient in therapeutic group addressing interpersonal concerns.  Short Term Goals: Engage patient in aftercare planning with referrals and resources, Increase social support and Increase ability to appropriately verbalize feelings  Therapeutic Interventions: Assess for all discharge needs, 1 to 1 time with Social worker, Explore available resources and support systems, Assess for adequacy in community support network, Educate family and significant other(s) on suicide prevention, Complete Psychosocial Assessment, Interpersonal group therapy.  Evaluation of Outcomes: Not Met   Progress in Treatment: Attending groups: No. Participating in groups: No. Taking medication as prescribed: Yes. Toleration medication: Yes. Family/Significant other contact made: No, will contact:  CSW will obtain consents Patient understands diagnosis: No. Discussing patient identified problems/goals with staff: No. Medical problems stabilized or resolved: Yes. Denies suicidal/homicidal ideation: Yes. Issues/concerns per patient self-inventory: Yes. Other: None  New problem(s) identified: No, Describe:  None  New Short Term/Long Term Goal(s):medication stabilization, elimination of SI thoughts, development of comprehensive mental wellness plan.  Patient Goals:    Discharge Plan or Barriers: Patient recently admitted. CSW will continue to follow and assess for appropriate referrals and possible discharge planning.  Reason for Continuation of Hospitalization: Medication  stabilization  Estimated Length of Stay: 3-5 days  Attendees: Patient: 01/11/2021   Physician:  01/11/2021   Nursing:  01/11/2021  RN Care Manager: 01/11/2021   Social Worker: Toney Reil, [place] 01/11/2021   Recreational Therapist:  01/11/2021   Other:  01/11/2021   Other:  01/11/2021   Other: 01/11/2021      Scribe for Treatment Team: Mliss Fritz, [place] 01/11/2021 3:15 PM

## 2021-01-11 NOTE — [facility] Suicide Risk Assessment (Signed)
[facility] Admission Suicide Risk Assessment   Nursing information obtained from:  Patient Demographic factors:  Male,Unemployed Current Mental Status:  NA Loss Factors:  Financial problems / change in socioeconomic status Historical Factors:  NA Risk Reduction Factors:  NA  Total Time spent with patient: 30 minutes Principal Problem: Schizophrenia, paranoid (H[facility]) Diagnosis:  Principal Problem:   Schizophrenia, paranoid (H[facility]) Active Problems:   Paranoid schizophrenia (H[facility])  Subjective Data: Patient is seen and examined.  Patient is a 30 year old male with reportedly a negative past psychiatric history who presented to the [facility] on 01/09/2021 on a voluntary basis.  His chief complaint at that time was "they told me to come here for an evaluation".  He stated at that time that his mother had been trying to get him out of the house for "a while".  The officer advised that his mother was going to go to the magistrate to place him under involuntary commitment.  The comprehensive clinical assessment team saw the patient on 01/09/2021.  He had been petitioned there by his mother.  The patient denied all behavioral health history, and stated he had been referred for an assessment.  He denied any current psychiatric medications.  He denied any suicidal ideation, homicidal ideation or auditory or visual hallucinations.  He denied any history of violence.  He stated that he had moved from [place] to [place] for "vacation".  According to collateral information that the patient lives with his mother and brother with limited additional supports.  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  He denied any legal charges.  He stated that he has "a marijuana card from [place]".  Collateral information from the mother reported that the patient has a history of "pretending" everything was okay.  He reportedly has a history of schizophrenia versus  bipolar disorder and had been noncompliant with his medications.  The patient had apparently not taken his medications in over a month.  He had not been tending to his personal hygiene, and was using NyQuil to sleep at night.  It was reported that he hallucinates and will turn and look at people as though he was responding to internal stimuli.  It was also noted that he got into an argument with his brother and had been very combative.  Psychiatric consultation with the nurse practitioner was obtained on 01/09/2021.  He denied any psychiatric history, and also apparently denied that he was HIV positive.  On reevaluation on 01/10/2021 no additional information was really obtained.  It also does not appear as though they requested any records from [place].  He had refused laboratories at the behavioral health urgent care center and was sent to the [facility] for laboratories.  We have minimal results at least at this point.  His influenza panel was negative.  Urinalysis was negative.  Drug screen was positive for marijuana.  EKG showed a sinus arrhythmia with a normal QTc interval.  We have no other information on him currently.  He is vague and provides no additional history.  The only other documentation in the chart is from 2013 when he was having GERD symptoms.  His transfer medications on admission included Zyprexa 5 mg p.o. nightly and and agitation protocol.  Continued Clinical Symptoms:  Alcohol Use Disorder Identification Test Final Score (AUDIT): 0 The "Alcohol Use Disorders Identification Test", Guidelines for Use in Primary Care, Second Edition.  World Science writer [facility] Score between 0-7:  no or low risk or alcohol related problems. Score between 8-15:  moderate risk of alcohol related problems. Score between 16-19:  high risk of alcohol related problems. Score 20 or above:  warrants further diagnostic evaluation for alcohol dependence and treatment.   CLINICAL FACTORS:    Schizophrenia:   Less than 32 years old Paranoid or undifferentiated type   Musculoskeletal: Strength & Muscle Tone: within normal limits Gait & Station: normal Patient leans: N/A  Psychiatric Specialty Exam:  Presentation  General Appearance: Disheveled  Eye Contact:Fair  Speech:Normal Rate  Speech Volume:Normal  Handedness:Right   Mood and Affect  Mood:Dysphoric  Affect:Blunt   Thought Process  Thought Processes:Goal Directed  Descriptions of Associations:Circumstantial  Orientation:Full (Time, Place and Person)  Thought Content:Logical  History of Schizophrenia/Schizoaffective disorder:No  Duration of Psychotic Symptoms:Less than six months  Hallucinations:Hallucinations: None Description of Auditory Hallucinations: Patient denies but was reported on IVC paperwork  Ideas of Reference:None  Suicidal Thoughts:Suicidal Thoughts: No  Homicidal Thoughts:Homicidal Thoughts: No   Sensorium  Memory:Immediate Poor; Recent Poor; Remote Poor  Judgment:Impaired  Insight:Lacking   Executive Functions  Concentration:Fair  Attention Span:Fair  Recall:Poor  Fund of Knowledge:Fair  Language:Fair   Psychomotor Activity  Psychomotor Activity:Psychomotor Activity: Decreased   Assets  Assets:Desire for Improvement; Resilience   Sleep  Sleep:Sleep: Good Number of Hours of Sleep: 7.5    Physical Exam: Physical Exam Vitals and nursing note reviewed.  HENT:     Head: Normocephalic and atraumatic.  Pulmonary:     Effort: Pulmonary effort is normal.  Neurological:     General: No focal deficit present.     Mental Status: He is alert and oriented to person, place, and time.    Review of Systems  All other systems reviewed and are negative.  Blood pressure 111/72, pulse 97, temperature 98.2 F (36.8 C), temperature source Oral, resp. rate 16, height 6' (1.829 m), weight 68 kg, SpO2 100 %. Body mass index is 20.34 kg/m.   COGNITIVE  FEATURES THAT CONTRIBUTE TO RISK:  Thought constriction (tunnel vision)    SUICIDE RISK:   Mild:  Suicidal ideation of limited frequency, intensity, duration, and specificity.  There are no identifiable plans, no associated intent, mild dysphoria and related symptoms, good self-control (both objective and subjective assessment), few other risk factors, and identifiable protective factors, including available and accessible social support.  PLAN OF CARE: Patient is seen and examined.  Patient is a 30 year old male with the above-stated past psychiatric history with a reported history of schizophrenia.  He will be admitted to the hospital.  He will be integrated in the milieu.  He will be encouraged to attend groups.  I am going to go on and increase his Zyprexa to 5 mg p.o. daily and 10 mg p.o. nightly.  We will continue olanzapine agitation protocol.  He will also have available trazodone.  His laboratories are rather limited, and it does not appear that an HIV was done as well.  I will order those today.  His vital signs are stable, he is afebrile.  We will also need to contact the mother or brother for collateral information.  I certify that inpatient services furnished can reasonably be expected to improve the patient's condition.   Antonieta Pert, MD 01/11/2021, 11:06 AM

## 2021-01-11 NOTE — [facility] Group Notes (Signed)
CSW Group Therapy Notes    Type of Therapy and Topic: Group Therapy: Effective Communication   Participation Level:  Did not attend   Description of Group:  In this group patients will be asked to identify their own styles of communication as well as defining and identifying passive, assertive, and aggressive styles of communication. Participants will identify strategies to communicate in a more assertive manner in an effort to appropriately meet their needs. This group will be process-oriented, with patients participating in exploration of their own experiences as well as giving and receiving support and challenge from other group members.   Therapeutic Goals: 1. Patient will identify their personal communication style. 2. Patient will identify passive, assertive, and aggressive forms of communication. 3. Patient will identify strategies for developing more effective communication to appropriately meet their needs.    Summary of Patient Progress: Pt was sleeping and did not wake up to attend.    Therapeutic Modalities:  Communication Skills Solution Focused Therapy Motivational Interviewing   Ruthann Cancer MSW, [employer] [facility] Social Worker  [facility]

## 2021-01-11 NOTE — Progress Notes (Signed)
    D:  Patient presents with elevated anxiety at medication window. Administered 25 mg Vistaril PRN  and 10 mg Olanzapine.  Patient returns to bed.  A:  Medication education provided; Patient supported emotionally; Patient asked to communicate his needs, concerns, and questions.  R: Patient remains safe with 15 minute checks; Will continue POC

## 2021-01-11 NOTE — Progress Notes (Signed)
Adult Psychoeducational Group Note  Date:  01/11/2021 Time:  7:59 PM  Group Topic/Focus:  Wrap-Up Group:   The focus of this group is to help patients review their daily goal of treatment and discuss progress on daily workbooks.  Participation Level:  Did Not Attend  Participation Quality:  did not attend  Affect:  did not attend  Cognitive:  did not attend  Insight: None  Engagement in Group:  did not attend  Modes of Intervention:  did not attend  Additional Comments:  did not attend  Sandi Mariscal 01/11/2021, 7:59 PM

## 2021-01-11 NOTE — Progress Notes (Signed)
NUTRITION ASSESSMENT RD working remotely.   Pt identified as at risk on the Malnutrition Screen Tool  INTERVENTION: - will order Ensure Enlive po BID, each supplement provides 350 kcal and 20 grams of protein. [facility] staff to continue to encourage PO intakes of meals, snacks, and oral nutrition supplements.    NUTRITION DIAGNOSIS: Unintentional weight loss related to sub-optimal intake as evidenced by pt report.   Goal: Pt to meet >/= 90% of their estimated nutrition needs.  Monitor:  PO intake  Assessment:  Patient admitted for paranoid schizophrenia with hx of schizophrenia vs bipolar disorder and has been non-compliant with medications. Notes indicate he appears to respond to internal stimuli, that he has been very combative, that he has not been managing personal hygiene, and that he uses NyQuil to sleep at night.   Weight yesterday documented as 150 lb which appears to be a stated weight. No other weight hx available in the chart.    30 y.o. male  Height: Ht Readings from Last 1 Encounters:  01/10/21 6' (1.829 m)    Weight: Wt Readings from Last 1 Encounters:  01/10/21 68 kg    Weight Hx: Wt Readings from Last 10 Encounters:  01/10/21 68 kg    BMI:  Body mass index is 20.34 kg/m. Pt meets criteria for normal weight based on current BMI.  Estimated Nutritional Needs: Kcal: 25-30 kcal/kg Protein: > 1 gram protein/kg Fluid: 1 ml/kcal  Diet Order:  Diet Order            Diet regular Room service appropriate? Yes; Fluid consistency: Thin  Diet effective now                Pt is also offered choice of unit snacks mid-morning and mid-afternoon.  Pt is eating as desired.   Lab results and medications reviewed.      Trenton Gammon, MS, RD, LDN, CNSC Inpatient Clinical Dietitian RD pager # available in [facility]  After hours/weekend pager # available in [facility]

## 2021-01-11 NOTE — H&P (Signed)
Psychiatric Admission Assessment Adult  Patient Identification: Benjamin Simpson MRN:  [id number] Date of Evaluation:  01/11/2021 Chief Complaint:  Paranoid schizophrenia (HCC) [F20.0] Principal Diagnosis: Schizophrenia, paranoid (HCC) Diagnosis:  Principal Problem:   Schizophrenia, paranoid (HCC) Active Problems:   Paranoid schizophrenia (HCC)  History of Present Illness: Patient is seen and examined.  Patient is a 30 year old male with reportedly a negative past psychiatric history who [facility] on a voluntary basis.  His chief complaint at that time was "they told me to come here for an evaluation".  He stated at that time that his mother had been trying to get him out of the house for "a while".  The officer advised that his mother was going to go to the magistrate to place him under involuntary commitment.  The comprehensive clinical assessment team saw the patient on 01/09/2021.  He had been petitioned there by his mother.  The patient denied all behavioral health history, and stated he had been referred for an assessment.  He denied any current psychiatric medications.  He denied any suicidal ideation, homicidal ideation or auditory or visual hallucinations.  He denied any history of violence.  He stated that he had moved from [place] to [place] for "vacation".  According to collateral information that the patient lives with his mother and brother with limited additional supports.  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  He denied any legal charges.  He stated that he has "a marijuana card from [place]".  Collateral information from the mother reported that the patient has a history of "pretending" everything was okay.  He reportedly has a history of schizophrenia versus bipolar disorder and had been noncompliant with his medications.  The patient had apparently not taken his medications in  over a month.  He had not been tending to his personal hygiene, and was using NyQuil to sleep at night.  It was reported that he hallucinates and will turn and look at people as though he was responding to internal stimuli.  It was also noted that he got into an argument with his brother and had been very combative.  Psychiatric consultation with the nurse practitioner was obtained on 01/09/2021.  He denied any psychiatric history, and also apparently denied that he was HIV positive.  On reevaluation on 01/10/2021 no additional information was really obtained.  It also does not appear as though they requested any records from [facility].  He had refused laboratories at the behavioral health urgent care center and was sent to [facility] for laboratories.  We have minimal results at least at this point.  His influenza panel was negative.  Urinalysis was negative.  Drug screen was positive for marijuana.  EKG showed a sinus arrhythmia with a normal QTc interval.  We have no other information on him currently.  He is vague and provides no additional history.  The only other documentation in the chart is from 2013 when he was having GERD symptoms.  His transfer medications on admission included Zyprexa 5 mg p.o. nightly and agitation protocol.  Associated Signs/Symptoms: Depression Symptoms:  anhedonia, insomnia, psychomotor agitation, difficulty concentrating, anxiety, loss of energy/fatigue, disturbed sleep, Duration of Depression Symptoms: No data recorded (Hypo) Manic Symptoms:  Delusions, Hallucinations, Impulsivity, Irritable Mood, Labiality of Mood, Anxiety Symptoms:  Excessive Worry, Psychotic Symptoms:  Delusions, Hallucinations: Auditory Paranoia, PTSD Symptoms: Negative Total Time spent with patient: 30 minutes  Past Psychiatric History: Reportedly a history of either schizophrenia or bipolar disorder.  He denied this.  He denied any previous admissions or psychiatric  medications.  Is the patient at risk to self? Yes.    Has the patient been a risk to self in the past 6 months? No.  Has the patient been a risk to self within the distant past? No.  Is the patient a risk to others? Yes.    Has the patient been a risk to others in the past 6 months? No.  Has the patient been a risk to others within the distant past? No.   Prior Inpatient Therapy:   Prior Outpatient Therapy:    Alcohol Screening: 1. How often do you have a drink containing alcohol?: Never 2. How many drinks containing alcohol do you have on a typical day when you are drinking?: 1 or 2 3. How often do you have six or more drinks on one occasion?: Never AUDIT-C Score: 0 4. How often during the last year have you found that you were not able to stop drinking once you had started?: Never 5. How often during the last year have you failed to do what was normally expected from you because of drinking?: Never 6. How often during the last year have you needed a first drink in the morning to get yourself going after a heavy drinking session?: Never 7. How often during the last year have you had a feeling of guilt of remorse after drinking?: Never 8. How often during the last year have you been unable to remember what happened the night before because you had been drinking?: Never 9. Have you or someone else been injured as a result of your drinking?: No 10. Has a relative or friend or a doctor or another health worker been concerned about your drinking or suggested you cut down?: No Alcohol Use Disorder Identification Test Final Score (AUDIT): 0 Substance Abuse History in the last 12 months:  Yes.   Consequences of Substance Abuse: Unknown Previous Psychotropic Medications: Yes  Psychological Evaluations: Yes  Past Medical History: History reviewed. No pertinent past medical history. History reviewed. No pertinent surgical history. Family History: History reviewed. No pertinent family  history. Family Psychiatric  History: Denied Tobacco Screening: Have you used any form of tobacco in the last 30 days? (Cigarettes, Smokeless Tobacco, Cigars, and/or Pipes): Yes Tobacco use, Select all that apply: 5 or more cigarettes per day Are you interested in Tobacco Cessation Medications?: No, patient refused Counseled patient on smoking cessation including recognizing danger situations, developing coping skills and basic information about quitting provided: Refused/Declined practical counseling Social History:  Social History   Substance and Sexual Activity  Alcohol Use Not Currently     Social History   Substance and Sexual Activity  Drug Use Not Currently    Additional Social History:                           Allergies:  No Known Allergies Lab Results:  Results for orders placed or performed during the hospital encounter of 01/09/21 (from the past 48 hour(s))  Resp Panel by RT-PCR (Flu A&B, Covid) Nasopharyngeal Swab     Status: None   Collection Time: 01/09/21  8:06 PM   Specimen: Nasopharyngeal Swab; Nasopharyngeal(NP) swabs in vial transport medium  Result Value Ref Range   SARS Coronavirus 2 by RT PCR NEGATIVE NEGATIVE    Comment: (NOTE) SARS-CoV-2 target nucleic acids  are NOT DETECTED.  The SARS-CoV-2 RNA is generally detectable in upper respiratory specimens during the acute phase of infection. The lowest concentration of SARS-CoV-2 viral copies this assay can detect is 138 copies/mL. A negative result does not preclude SARS-Cov-2 infection and should not be used as the sole basis for treatment or other patient management decisions. A negative result may occur with  improper specimen collection/handling, submission of specimen other than nasopharyngeal swab, presence of viral mutation(s) within the areas targeted by this assay, and inadequate number of viral copies(<138 copies/mL). A negative result must be combined with clinical observations,  patient history, and epidemiological information. The expected result is Negative.  Fact Sheet for Patients:  BloggerCourse.com  Fact Sheet for Healthcare Providers:  SeriousBroker.it  This test is no t yet approved or cleared by the [place] FDA and  has been authorized for detection and/or diagnosis of SARS-CoV-2 by FDA under an Emergency Use Authorization (EUA). This EUA will remain  in effect (meaning this test can be used) for the duration of the COVID-19 declaration under Section 564(b)(1) of the Act, 21 U.S.C.section 360bbb-3(b)(1), unless the authorization is terminated  or revoked sooner.       Influenza A by PCR NEGATIVE NEGATIVE   Influenza B by PCR NEGATIVE NEGATIVE    Comment: (NOTE) The Xpert Xpress SARS-CoV-2/FLU/RSV plus assay is intended as an aid in the diagnosis of influenza from Nasopharyngeal swab specimens and should not be used as a sole basis for treatment. Nasal washings and aspirates are unacceptable for Xpert Xpress SARS-CoV-2/FLU/RSV testing.  Fact Sheet for Patients: BloggerCourse.com  Fact Sheet for Healthcare Providers: SeriousBroker.it  This test is not yet approved or cleared by the [place] FDA and has been authorized for detection and/or diagnosis of SARS-CoV-2 by FDA under an Emergency Use Authorization (EUA). This EUA will remain in effect (meaning this test can be used) for the duration of the COVID-19 declaration under Section 564(b)(1) of the Act, 21 U.S.C. section 360bbb-3(b)(1), unless the authorization is terminated or revoked.  Performed at [facility], [address] [place], [place] [address]   POC SARS Coronavirus 2 Ag-ED - Nasal Swab     Status: None (Preliminary result)   Collection Time: 01/09/21  8:06 PM  Result Value Ref Range   SARS Coronavirus 2 Ag Negative Negative  POCT Urine Drug Screen - (ICup)      Status: Abnormal   Collection Time: 01/09/21  8:07 PM  Result Value Ref Range   POC Amphetamine UR None Detected NONE DETECTED (Cut Off Level 1000 ng/mL)   POC Secobarbital (BAR) None Detected NONE DETECTED (Cut Off Level 300 ng/mL)   POC Buprenorphine (BUP) None Detected NONE DETECTED (Cut Off Level 10 ng/mL)   POC Oxazepam (BZO) None Detected NONE DETECTED (Cut Off Level 300 ng/mL)   POC Cocaine UR None Detected NONE DETECTED (Cut Off Level 300 ng/mL)   POC Methamphetamine UR None Detected NONE DETECTED (Cut Off Level 1000 ng/mL)   POC Morphine None Detected NONE DETECTED (Cut Off Level 300 ng/mL)   POC Oxycodone UR None Detected NONE DETECTED (Cut Off Level 100 ng/mL)   POC Methadone UR None Detected NONE DETECTED (Cut Off Level 300 ng/mL)   POC Marijuana UR Positive (A) NONE DETECTED (Cut Off Level 50 ng/mL)  POC SARS Coronavirus 2 Ag     Status: None   Collection Time: 01/09/21  8:09 PM  Result Value Ref Range   SARSCOV2ONAVIRUS 2 AG NEGATIVE NEGATIVE  Comment: (NOTE) SARS-CoV-2 antigen NOT DETECTED.   Negative results are presumptive.  Negative results do not preclude SARS-CoV-2 infection and should not be used as the sole basis for treatment or other patient management decisions, including infection  control decisions, particularly in the presence of clinical signs and  symptoms consistent with COVID-19, or in those who have been in contact with the virus.  Negative results must be combined with clinical observations, patient history, and epidemiological information. The expected result is Negative.  Fact Sheet for Patients: https://www.jennings-kim.com/  Fact Sheet for Healthcare Providers: https://alexander-rogers.biz/  This test is not yet approved or cleared by the [place] FDA and  has been authorized for detection and/or diagnosis of SARS-CoV-2 by FDA under an Emergency Use Authorization (EUA).  This EUA will remain in effect (meaning  this test can be used) for the duration of  the COV ID-19 declaration under Section 564(b)(1) of the Act, 21 U.S.C. section 360bbb-3(b)(1), unless the authorization is terminated or revoked sooner.    Urinalysis, Routine w reflex microscopic Urine, Clean Catch     Status: Abnormal   Collection Time: 01/10/21  6:29 AM  Result Value Ref Range   Color, Urine STRAW (A) YELLOW   APPearance CLEAR CLEAR   Specific Gravity, Urine 1.005 1.005 - 1.030   pH 6.0 5.0 - 8.0   Glucose, UA NEGATIVE NEGATIVE mg/dL   Hgb urine dipstick NEGATIVE NEGATIVE   Bilirubin Urine NEGATIVE NEGATIVE   Ketones, ur NEGATIVE NEGATIVE mg/dL   Protein, ur NEGATIVE NEGATIVE mg/dL   Nitrite NEGATIVE NEGATIVE   Leukocytes,Ua NEGATIVE NEGATIVE    Comment: Performed at [facility], [address] [place], [place] [address]    Blood Alcohol level:  No results found for: [facility]  Metabolic Disorder Labs:  No results found for: HGBA1C, MPG No results found for: PROLACTIN No results found for: CHOL, TRIG, HDL, CHOLHDL, VLDL, LDLCALC  Current Medications: Current Facility-Administered Medications  Medication Dose Route Frequency Provider Last Rate Last Admin  . acetaminophen (TYLENOL) tablet 650 mg  650 mg Oral Q6H PRN Novella Olive, NP      . alum & mag hydroxide-simeth (MAALOX/MYLANTA) 200-200-20 MG/5ML suspension 30 mL  30 mL Oral Q4H PRN Novella Olive, NP      . hydrOXYzine (ATARAX/VISTARIL) tablet 25 mg  25 mg Oral TID PRN Novella Olive, NP   25 mg at 01/10/21 2121  . OLANZapine zydis (ZYPREXA) disintegrating tablet 10 mg  10 mg Oral Q8H PRN Antonieta Pert, MD       And  . LORazepam (ATIVAN) tablet 1 mg  1 mg Oral Q6H PRN Antonieta Pert, MD       And  . ziprasidone (GEODON) injection 20 mg  20 mg Intramuscular Q6H PRN Antonieta Pert, MD      . magnesium hydroxide (MILK OF MAGNESIA) suspension 30 mL  30 mL Oral Daily PRN Novella Olive, NP      . OLANZapine (ZYPREXA) tablet 5 mg  5 mg Oral  Daily Novella Olive, NP   5 mg at 01/11/21 1443  . OLANZapine zydis (ZYPREXA) disintegrating tablet 10 mg  10 mg Oral QHS Novella Olive, NP   10 mg at 01/10/21 2121  . traZODone (DESYREL) tablet 50 mg  50 mg Oral QHS PRN Novella Olive, NP   50 mg at 01/10/21 2121   PTA Medications: Medications Prior to Admission  Medication Sig Dispense Refill Last Dose  .  OLANZapine (ZYPREXA) 5 MG tablet Take 1 tablet (5 mg total) by mouth daily.     [name] OLANZapine zydis (ZYPREXA) 10 MG disintegrating tablet Take 1 tablet (10 mg total) by mouth at bedtime.       Musculoskeletal: Strength & Muscle Tone: within normal limits Gait & Station: normal Patient leans: N/A            Psychiatric Specialty Exam:  Presentation  General Appearance: Disheveled  Eye Contact:Fair  Speech:Normal Rate  Speech Volume:Normal  Handedness:Right   Mood and Affect  Mood:Dysphoric  Affect:Blunt   Thought Process  Thought Processes:Goal Directed  Duration of Psychotic Symptoms: Less than six months  Past Diagnosis of Schizophrenia or Psychoactive disorder: No  Descriptions of Associations:Circumstantial  Orientation:Full (Time, Place and Person)  Thought Content:Logical  Hallucinations:Hallucinations: None Description of Auditory Hallucinations: Patient denies but was reported on IVC paperwork  Ideas of Reference:None  Suicidal Thoughts:Suicidal Thoughts: No  Homicidal Thoughts:Homicidal Thoughts: No   Sensorium  Memory:Immediate Poor; Recent Poor; Remote Poor  Judgment:Impaired  Insight:Lacking   Executive Functions  Concentration:Fair  Attention Span:Fair  Recall:Poor  Fund of Knowledge:Fair  Language:Fair   Psychomotor Activity  Psychomotor Activity:Psychomotor Activity: Decreased   Assets  Assets:Desire for Improvement; Resilience   Sleep  Sleep:Sleep: Good Number of Hours of Sleep: 7.5    Physical Exam: Physical Exam Vitals and nursing note  reviewed.  HENT:     Head: Normocephalic and atraumatic.  Pulmonary:     Effort: Pulmonary effort is normal.  Neurological:     General: No focal deficit present.     Mental Status: He is alert and oriented to person, place, and time.    Review of Systems  All other systems reviewed and are negative.  Blood pressure 111/72, pulse 97, temperature 98.2 F (36.8 C), temperature source Oral, resp. rate 16, height 6' (1.829 m), weight 68 kg, SpO2 100 %. Body mass index is 20.34 kg/m.  Treatment Plan Summary: Daily contact with patient to assess and evaluate symptoms and progress in treatment, Medication management and Plan : Patient is seen and examined.  Patient is a 30 year old male with the above-stated past psychiatric history with a reported history of schizophrenia.  He will be admitted to the hospital.  He will be integrated in the milieu.  He will be encouraged to attend groups.  I am going to go on and increase his Zyprexa to 5 mg p.o. daily and 10 mg p.o. nightly.  We will continue olanzapine agitation protocol.  He will also have available trazodone.  His laboratories are rather limited, and it does not appear that an HIV was done as well.  I will order those today.  His vital signs are stable, he is afebrile.  We will also need to contact the mother or brother for collateral information.  Observation Level/Precautions:  15 minute checks  Laboratory:  CBC Chemistry Profile  Psychotherapy:    Medications:    Consultations:    Discharge Concerns:    Estimated LOS:  Other:     Physician Treatment Plan for Primary Diagnosis: Schizophrenia, paranoid (HCC) Long Term Goal(s): Improvement in symptoms so as ready for discharge  Short Term Goals: Ability to identify changes in lifestyle to reduce recurrence of condition will improve, Ability to verbalize feelings will improve, Ability to demonstrate self-control will improve, Ability to identify and develop effective coping behaviors  will improve, Ability to maintain clinical measurements within normal limits will improve, Compliance with prescribed medications will improve and  Ability to identify triggers associated with substance abuse/mental health issues will improve  Physician Treatment Plan for Secondary Diagnosis: Principal Problem:   Schizophrenia, paranoid (HCC) Active Problems:   Paranoid schizophrenia (HCC)  Long Term Goal(s): Improvement in symptoms so as ready for discharge  Short Term Goals: Ability to identify changes in lifestyle to reduce recurrence of condition will improve, Ability to verbalize feelings will improve, Ability to demonstrate self-control will improve, Ability to identify and develop effective coping behaviors will improve, Ability to maintain clinical measurements within normal limits will improve, Compliance with prescribed medications will improve and Ability to identify triggers associated with substance abuse/mental health issues will improve  I certify that inpatient services furnished can reasonably be expected to improve the patient's condition.    Antonieta PertGreg Lawson Cougar Imel, MD 6/3/202212:12 PM

## 2021-01-12 LAB — URINALYSIS, COMPLETE (UACMP) WITH MICROSCOPIC
Bilirubin Urine: NEGATIVE
Glucose, UA: NEGATIVE mg/dL
Hgb urine dipstick: NEGATIVE
Ketones, ur: NEGATIVE mg/dL
Leukocytes,Ua: NEGATIVE
Nitrite: NEGATIVE
Protein, ur: NEGATIVE mg/dL
Specific Gravity, Urine: 1.02 (ref 1.005–1.030)
pH: 5.5 (ref 5.0–8.0)

## 2021-01-12 MED ORDER — DIVALPROEX SODIUM 500 MG PO DR TAB
500.0000 mg | DELAYED_RELEASE_TABLET | Freq: Every day | ORAL | Status: DC
  Administered 2021-01-12 – 2021-01-14 (×3): 500 mg via ORAL
  Filled 2021-01-12 (×7): qty 1

## 2021-01-12 MED ORDER — PANTOPRAZOLE SODIUM 40 MG PO TBEC
40.0000 mg | DELAYED_RELEASE_TABLET | Freq: Every day | ORAL | Status: DC
  Administered 2021-01-12 – 2021-01-15 (×4): 40 mg via ORAL
  Filled 2021-01-12 (×7): qty 1

## 2021-01-12 MED ORDER — DIVALPROEX SODIUM 250 MG PO DR TAB
250.0000 mg | DELAYED_RELEASE_TABLET | ORAL | Status: AC
  Administered 2021-01-12: 250 mg via ORAL
  Filled 2021-01-12 (×2): qty 1

## 2021-01-12 NOTE — Progress Notes (Signed)
Adult Psychoeducational Group Note  Date:  01/12/2021 Time:  10:31 PM  Group Topic/Focus:  Wrap-Up Group:   The focus of this group is to help patients review their daily goal of treatment and discuss progress on daily workbooks.  Participation Level:  Minimal  Participation Quality:  Appropriate  Affect:  Appropriate  Cognitive:  Disorganized  Insight: Appropriate  Engagement in Group:  Limited  Modes of Intervention:  Discussion  Additional Comments: Pt stated his goal for today was to focus on his treatment plan. Pt stated he accomplished his goal today. Pt stated he talk with his doctor and his social worker about his care today. Pt rated his overall day a 4 out of 10.  Pt stated he talk with his sister and his aunt today which improved his day.  Pt stated he attend all groups today.  Pt stated he took all medications provided today. Pt stated his appetite was poor today. Pt rated sleep last night as good. Pt stated the goal tonight was to get some rest. Pt stated he had no physical pain today. Pt deny visual hallucinations and auditory issues tonight.  Pt denies thoughts of harming himself and others. Pt stated he would alert staff if anything changed.   Benjamin Simpson 01/12/2021, 10:31 PM

## 2021-01-12 NOTE — BHH Counselor (Signed)
Adult Comprehensive Assessment  Patient ID: Benjamin Simpson, male   DOB: 1990/09/17, 30 y.o.   MRN: [id number]  Information Source: Information source: Patient  Current Stressors:  Patient states their primary concerns and needs for treatment are:: Was supposed to just get a mental evaluation at [facility] and let out, does not know why he is here now. Patient states their goals for this hospitilization and ongoing recovery are:: To get out, cause the least problems as possible. Educational / Learning stressors: Denies stressors Employment / Job issues: Finding a good job, "I'm not going to just settle.  I just want a seasonal job." Family Relationships: Very stressful.  (has just been down here from [place] for vacation, came to check on his family, only stayed with them 2 days) Financial / Lack of resources (include bankruptcy): Stressful Housing / Lack of housing: Very stressful. Physical health (include injuries & life threatening diseases): Denies stressors Social relationships: Denies stressors Substance abuse: Denies stressors Bereavement / Loss: Financial controller and grandfather  Living/Environment/Situation:  Living Arrangements: Alone,Other (Comment) Living conditions (as described by patient or guardian): Hotel Who else lives in the home?: Hotel - alone How long has patient lived in current situation?: 2-3 weeks What is atmosphere in current home: Temporary  Family History:  Marital status: Single Does patient have children?: Yes How many children?: 2 How is patient's relationship with their children?: 3yo and 4yo daughters - nice relationship  Childhood History:  By whom was/is the patient raised?: Mother,Mother/father and step-parent Additional childhood history information: stable childhood Description of patient's relationship with caregiver when they were a child: Mother - sweet, always had his needs provided for; Father - off and on relationship; Stepfathers - closer than to  real dad Patient's description of current relationship with people who raised him/her: Stepfather - tries; Mother - not very mobile, 60yo, only so much she can do.  Father - no relationship, does not know where he is How were you disciplined when you got in trouble as a child/adolescent?: Punishments Does patient have siblings?: Yes Number of Siblings: 3 Description of patient's current relationship with siblings: Good relationships Did patient suffer any verbal/emotional/physical/sexual abuse as a child?: No Did patient suffer from severe childhood neglect?: No Has patient ever been sexually abused/assaulted/raped as an adolescent or adult?: No Was the patient ever a victim of a crime or a disaster?: No Witnessed domestic violence?: No Has patient been affected by domestic violence as an adult?: No  Education:  Highest grade of school patient has completed: Associate's degree Currently a student?: No Learning disability?: No  Employment/Work Situation:   Employment situation: Employed 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 Has patient ever been in the [employer]?: No  Financial Resources:   Surveyor, quantity resources: Income from employment Does patient have a representative payee or guardian?: No  Alcohol/Substance Abuse:   What has been your use of drugs/alcohol within the last 12 months?: Medical marijuana card Has alcohol/substance abuse ever caused legal problems?: No  Social Support System:   Forensic psychologist System: Poor Describe Community Support System: Self Type of faith/religion: Christianity How does patient's faith help to cope with current illness?: Praying, reading Bible  Leisure/Recreation:   Do You Have Hobbies?:  Yes Leisure and Hobbies: reading,  puzzles, watching TV  Strengths/Needs:   What is the patient's perception of their strengths?: Intelligence, caring heart Patient states they can use these personal strengths during their treatment to contribute to their recovery: Keep pushing on, knowing this is temporary Patient states these barriers may affect/interfere with their treatment: None Patient states these barriers may affect their return to the community: None Other important information patient would like considered in planning for their treatment: None  Discharge Plan:   Currently receiving community mental health services: No Patient states concerns and preferences for aftercare planning are: Does not want follow-up, does not feel he has any mental problems. Patient states they will know when they are safe and ready for discharge when: "When I'm pushed like I have been, and I'm not snapping out or cursing or anything, I know I'm ready." Does patient have access to transportation?: No Does patient have financial barriers related to discharge medications?: Yes Patient description of barriers related to discharge medications: Has little income and no insurance Plan for no access to transportation at discharge: "I'll find a way."+ Plan for living situation after discharge: Wants to go to a hotel until returns to [place] Will patient be returning to same living situation after discharge?: No  Summary/Recommendations:   Summary and Recommendations (to be completed by the evaluator): Patient is a 30yo male hospitalized under IVC with a report of medication noncompliance for treatment of Schizophrenia/Bipolar Disorder.  The patient reportedly came to [place] from [place] 2-3 weeks ago and has been staying with mother and brother, working at a Surveyor, quantity in [place].  Mother Sunny Schlein [place] [phone]) says that he was "locked up" in [place], and patient reports he only came to [place] on  vacation to check on his family, is planning to return there as soon as possible.  His birthdate is listed in Epic as 1990/10/07, but he reports that he was born on [birth year], and says his last name is Mayford Knife, NOT [place] as listed.  He also denies knowing who [name] is.   He states he has been living in a hotel because he could not tolerate staying with the family more than 2 nights.  He reports having a medical marijuana card in [place], but denies any alcohol or other drug use.  He declines any follow-up, saying he will return to [place] soon and does not have "psychotic issues or mental issues." Patient would benefit from crisis stabilization, group therapy, medication management, psychoeducation, peer interaction and discharge planning.  At discharge it is recommended that the patient adhere to the established aftercare plan.  Lynnell Chad. 01/12/2021

## 2021-01-12 NOTE — BHH Group Notes (Signed)
  BHH/BMU LCSW Group Therapy Note  Date/Time:  01/12/2021 11:15AM-12:00PM  Type of Therapy and Topic:  Group Therapy:  Feelings About Hospitalization  Participation Level:  Active   Description of Group This process group involved patients discussing their feelings related to being hospitalized, as well as the benefits they see to being in the hospital.  These feelings and benefits were itemized.  The group then brainstormed specific ways in which they could seek those same benefits when they discharge and return home.  Therapeutic Goals Patient will identify and describe positive and negative feelings related to hospitalization Patient will verbalize benefits of hospitalization to themselves personally Patients will brainstorm together ways they can obtain similar benefits in the outpatient setting, identify barriers to wellness and possible solutions  Summary of Patient Progress:  The patient expressed his primary feelings about being hospitalized are that he does not like it, because he has no mental health issues.  He explained that when he was in another state and on his own, he never was institutionalized, but when he came to help his family, they put him in the hospital.  So his answer for how to avoid hospitalization in the future is to avoid his family.  He feels that doctors don't listen so even though medicines might be warranted for some people, he does not feel he needs them.  He also said he keeps telling the doctors that he needs the lowest dose possible of Zyprexa, and again feel they are not listening to him.  He went on a long explanation of how marijuana is the answer to mental health problems in general and why he uses it.  Therapeutic Modalities Cognitive Behavioral Therapy Motivational Interviewing    Ambrose Mantle, LCSW 01/12/2021, 1:01 PM

## 2021-01-12 NOTE — Progress Notes (Signed)
[facility] MD Progress Note  01/12/2021 1:52 PM Benjamin Simpson  MRN:  [id number] Subjective: Patient is a 30 year old male with a reported negative past psychiatric history who presented to the [facility] on 01/09/2021 on a voluntary basis.  His chief complaint at that time was "they told me to come here for an evaluation".  The officer advised that his mother was going to the magistrate to place him under involuntary commitment.  He had been petitioned there by his mother secondary to aggressive behavior and paranoia.  Objective: Patient is seen and examined.  Patient is a 30 year old male with the above-stated past psychiatric history who is seen in follow-up.  He continues to be irritable and argumentative.  He is irritated over the fact that he does not have paperwork from legal authorities to hold him.  He stated that he is here on a voluntary basis to "get an evaluation".  I told him that we had had a discussion yesterday, and he was unable to remember that.  I discussed the fact that his mother had involuntarily committed him because of a fight between his brother, his personal hygiene, and some of the other complaints that were made in the involuntary commitment.  He argued that those are not true, and that he does not have any form of a psychiatric history.  I mention to him that he told me yesterday that he was here on vacation, and then also mentioned that he was working at some seasonal job at a [employer].  He once again contradicted himself and stated that was not true.  He stated that he had no desire to sue me, but would do what was necessary.  He continues to deny any previous psychiatric history.  No new laboratories.  His vital signs are stable, he is afebrile.  Pulse oximetry on room air was 99%.  He slept 8.5 hours last night.  Principal Problem: Schizophrenia, paranoid (HCC) Diagnosis: Principal Problem:   Schizophrenia, paranoid (HCC) Active Problems:    Paranoid schizophrenia (HCC)  Total Time spent with patient: 20 minutes  Past Psychiatric History: See admission H&P  Past Medical History: History reviewed. No pertinent past medical history. History reviewed. No pertinent surgical history. Family History: History reviewed. No pertinent family history. Family Psychiatric  History: See admission H&P Social History:  Social History   Substance and Sexual Activity  Alcohol Use Not Currently     Social History   Substance and Sexual Activity  Drug Use Not Currently    Social History   Socioeconomic History  . Marital status: Single    Spouse name: Not on file  . Number of children: Not on file  . Years of education: Not on file  . Highest education level: Not on file  Occupational History  . Not on file  Tobacco Use  . Smoking status: Current Every Day Smoker    Packs/day: 1.00    Years: 10.00    Pack years: 10.00  . Smokeless tobacco: Never Used  . Tobacco comment: Patient refused  Vaping Use  . Vaping Use: Never used  Substance and Sexual Activity  . Alcohol use: Not Currently  . Drug use: Not Currently  . Sexual activity: Not on file  Other Topics Concern  . Not on file  Social History Narrative  . Not on file   Social Determinants of Health   Financial Resource Strain: Not on file  Food Insecurity: Not on file  Transportation Needs: Not  on file  Physical Activity: Not on file  Stress: Not on file  Social Connections: Not on file   Additional Social History:                         Sleep: Good  Appetite:  Fair  Current Medications: Current Facility-Administered Medications  Medication Dose Route Frequency Provider Last Rate Last Admin  . acetaminophen (TYLENOL) tablet 650 mg  650 mg Oral Q6H PRN Novella Olive, NP      . alum & mag hydroxide-simeth (MAALOX/MYLANTA) 200-200-20 MG/5ML suspension 30 mL  30 mL Oral Q4H PRN Novella Olive, NP      . feeding supplement (ENSURE ENLIVE / ENSURE  PLUS) liquid 237 mL  237 mL Oral BID BM Antonieta Pert, MD   237 mL at 01/12/21 1050  . hydrOXYzine (ATARAX/VISTARIL) tablet 25 mg  25 mg Oral TID PRN Novella Olive, NP   25 mg at 01/11/21 2130  . OLANZapine zydis (ZYPREXA) disintegrating tablet 10 mg  10 mg Oral Q8H PRN Antonieta Pert, MD       And  . LORazepam (ATIVAN) tablet 1 mg  1 mg Oral Q6H PRN Antonieta Pert, MD       And  . ziprasidone (GEODON) injection 20 mg  20 mg Intramuscular Q6H PRN Antonieta Pert, MD      . magnesium hydroxide (MILK OF MAGNESIA) suspension 30 mL  30 mL Oral Daily PRN Novella Olive, NP      . OLANZapine (ZYPREXA) tablet 5 mg  5 mg Oral Daily Novella Olive, NP   5 mg at 01/12/21 0830  . OLANZapine zydis (ZYPREXA) disintegrating tablet 10 mg  10 mg Oral QHS Novella Olive, NP   10 mg at 01/11/21 2130  . traZODone (DESYREL) tablet 50 mg  50 mg Oral QHS PRN Novella Olive, NP   50 mg at 01/11/21 2130    Lab Results:  Results for orders placed or performed during the hospital encounter of 01/10/21 (from the past 48 hour(s))  TSH     Status: None   Collection Time: 01/11/21  6:23 PM  Result Value Ref Range   TSH 0.458 0.350 - 4.500 uIU/mL    Comment: Performed by a 3rd Generation assay with a functional sensitivity of <=0.01 uIU/mL. Performed at [facility], [address] [place], [place] [address]   Comprehensive metabolic panel     Status: Abnormal   Collection Time: 01/11/21  6:23 PM  Result Value Ref Range   Sodium 139 135 - 145 mmol/L   Potassium 3.8 3.5 - 5.1 mmol/L   Chloride 103 98 - 111 mmol/L   CO2 27 22 - 32 mmol/L   Glucose, Bld 131 (H) 70 - 99 mg/dL    Comment: Glucose reference range applies only to samples taken after fasting for at least 8 hours.   BUN 12 6 - 20 mg/dL   Creatinine, Ser 1.75 0.61 - 1.24 mg/dL   Calcium 9.8 8.9 - 10.2 mg/dL   Total Protein 7.9 6.5 - 8.1 g/dL   Albumin 4.5 3.5 - 5.0 g/dL   AST 22 15 - 41 U/L   ALT 20 0 - 44 U/L    Alkaline Phosphatase 55 38 - 126 U/L   Total Bilirubin 1.9 (H) 0.3 - 1.2 mg/dL   GFR, Estimated >58 >52 mL/min    Comment: (NOTE) Calculated using the CKD-EPI Creatinine  Equation (2021)    Anion gap 9 5 - 15    Comment: Performed at [facility], [address] [place], [place] [address]    Blood Alcohol level:  No results found for: [facility]  Metabolic Disorder Labs: No results found for: HGBA1C, MPG No results found for: PROLACTIN No results found for: CHOL, TRIG, HDL, CHOLHDL, VLDL, LDLCALC  Physical Findings: AIMS:  , ,  ,  ,    CIWA:    COWS:     Musculoskeletal: Strength & Muscle Tone: within normal limits Gait & Station: normal Patient leans: N/A  Psychiatric Specialty Exam:  Presentation  General Appearance: Disheveled  Eye Contact:Good  Speech:Normal Rate  Speech Volume:Increased  Handedness:Right   Mood and Affect  Mood:Irritable; Dysphoric  Affect:Congruent   Thought Process  Thought Processes:Goal Directed  Descriptions of Associations:Circumstantial  Orientation:Full (Time, Place and Person)  Thought Content:Delusions; Paranoid Ideation  History of Schizophrenia/Schizoaffective disorder:Yes  Duration of Psychotic Symptoms:Greater than six months  Hallucinations:Hallucinations: None  Ideas of Reference:Paranoia; Delusions  Suicidal Thoughts:Suicidal Thoughts: No  Homicidal Thoughts:Homicidal Thoughts: No   Sensorium  Memory:Immediate Poor; Recent Poor; Remote Poor  Judgment:Impaired  Insight:Lacking   Executive Functions  Concentration:Fair  Attention Span:Fair  Recall:Poor  Fund of Knowledge:Poor  Language:Good   Psychomotor Activity  Psychomotor Activity:Psychomotor Activity: Normal   Assets  Assets:Desire for Improvement; Housing; Resilience; Social Support   Sleep  Sleep:Sleep: Good Number of Hours of Sleep: 8.5    Physical Exam: Physical Exam Vitals and nursing note reviewed.   HENT:     Head: Normocephalic and atraumatic.  Pulmonary:     Effort: Pulmonary effort is normal.  Neurological:     General: No focal deficit present.     Mental Status: He is alert and oriented to person, place, and time.    Review of Systems  All other systems reviewed and are negative.  Blood pressure 115/79, pulse 69, temperature 97.7 F (36.5 C), temperature source Oral, resp. rate 16, height 6' (1.829 m), weight 68 kg, SpO2 99 %. Body mass index is 20.34 kg/m.   Treatment Plan Summary: Daily contact with patient to assess and evaluate symptoms and progress in treatment, Medication management and Plan : Patient is seen and examined.  Patient is a 30 year old male with the above-stated past psychiatric history who is seen in follow-up.  Diagnosis: 1.  Reported history of schizophrenia versus schizoaffective disorder; bipolar type 2.  Reportedly HIV positive  Pertinent findings on examination today: 1.  Patient continues to deny any of the issues that are in the involuntary commitment paperwork from his mother. 2.  He is focused on why he has not received any paperwork with regard to his involuntary commitment. 3.  He continues to accuse me in the hospital of illegally holding him. 4.  He continues to contradict himself with regard to things he told me about his history and so currently it is very confusing. 5.  I have left a message with his mother to try and clarify things, and hopefully find out what medications in the past has been successful to help him. 6.  He remains paranoid and guarded 7.  EKG showed a sinus bradycardia with a sinus arrhythmia and a QTc interval of 407.  Plan: 1.  Continue Zyprexa 5 mg p.o. daily and 10 mg p.o. nightly for psychosis. 2.  Add Depakote DR 500 mg p.o. nightly for mood stability and an attempt to decrease irritability. 3.  Write for an HIV  test 4.  Continue Ensure 5.  Continue Zyprexa agitation protocol. 6.  I have called and left a  message with his mother to get collateral information 7.  Disposition planning-in progress. Antonieta PertGreg Lawson Emaleigh Guimond, MD 01/12/2021, 1:52 PM

## 2021-01-12 NOTE — BHH Group Notes (Signed)
BHH Group Notes:  (Nursing/MHT/Case Management/Adjunct)  Date:  01/12/2021  Time:  10:36 AM  Type of Therapy:  Group Therapy  Participation Level:  Active  Participation Quality:  Appropriate  Affect:  Appropriate  Cognitive:  Alert and Appropriate  Insight:  Appropriate and Good  Engagement in Group:  Engaged  Modes of Intervention:  Orientation  Summary of Progress/Problems: His goal today is to find out how to get discharged.   Hermenia Fritcher J Jarae Panas 01/12/2021, 10:36 AM

## 2021-01-13 NOTE — Progress Notes (Signed)
Adult Psychoeducational Group Note  Date:  01/13/2021 Time:  10:21 PM  Group Topic/Focus:  Wrap-Up Group:   The focus of this group is to help patients review their daily goal of treatment and discuss progress on daily workbooks.  Participation Level:  Minimal  Participation Quality:  Appropriate  Affect:  Anxious and Depressed  Cognitive:  Appropriate  Insight: Limited  Engagement in Group:  Limited  Modes of Intervention:  Discussion  Additional Comments: Pt stated his goal for today was to focus on his treatment plan. Pt stated he accomplished his goal today. Pt stated he talk with his doctorbut did not get a chance to talk with hissocial worker about his care today. Pt rated his overall day a6 out of10.  Pt stated he talk with his aunt today which improved his day. Pt stated he took all medications provided today. Pt stated his appetite was goodtoday. Pt rated sleep last night as good. Pt stated the goal tonight was to get some rest. Pt stated he had nophysical pain today. Pt deny visual hallucinations and auditory issues tonight. Pt denies thoughts of harming himself and others. Pt stated he would alert staff if anything changed.   [name] 01/13/2021, 10:21 PM

## 2021-01-13 NOTE — Progress Notes (Signed)
   01/11/21 2100  COVID-19 Daily Checkoff  Have you had a fever (temp > 37.80C/100F)  in the past 24 hours?  No  If you have had runny nose, nasal congestion, sneezing in the past 24 hours, has it worsened? No  COVID-19 EXPOSURE  Have you traveled outside the state in the past 14 days? No  Have you been in contact with someone with a confirmed diagnosis of COVID-19 or PUI in the past 14 days without wearing appropriate PPE? No  Have you been living in the same home as a person with confirmed diagnosis of COVID-19 or a PUI (household contact)? No  Have you been diagnosed with COVID-19? No

## 2021-01-13 NOTE — Progress Notes (Signed)
Patient got up to attend group. Currently in the dayroom with peers, calm.

## 2021-01-13 NOTE — BHH Group Notes (Signed)
BHH Group Notes:  (Nursing/MHT/Case Management/Adjunct)  Date:  01/13/2021  Time:  9:22 AM  Type of Therapy:  Group Therapy  Participation Level:  Active  Participation Quality:  Appropriate  Affect:  Appropriate  Cognitive:  Alert and Appropriate  Insight:  Appropriate  Engagement in Group:  Engaged  Modes of Intervention:  Orientation  Summary of Progress/Problems: Pt goal for today is to do what he has to do in order to get out.   Gema Ringold J Jannette Cotham 01/13/2021, 9:22 AM

## 2021-01-13 NOTE — BHH Group Notes (Signed)
BHH LCSW Group Therapy Note  Date/Time:  01/13/2021  11:00AM-12:00PM  Type of Therapy and Topic:  Group Therapy:  Music and Mood  Participation Level:  Minimal   Description of Group: In this process group, members listened to a variety of genres of music and identified that different types of music evoke different responses.  Patients were encouraged to identify music that was soothing for them and music that was energizing for them.  Patients discussed how this knowledge can help with wellness and recovery in various ways including managing depression and anxiety as well as encouraging healthy sleep habits.    Therapeutic Goals: 1. Patients will explore the impact of different varieties of music on mood 2. Patients will verbalize the thoughts they have when listening to different types of music 3. Patients will identify music that is soothing to them as well as music that is energizing to them 4. Patients will discuss how to use this knowledge to assist in maintaining wellness and recovery 5. Patients will explore the use of music as a coping skill  Summary of Patient Progress:  At the beginning of group, patient expressed that he felt good but impatient.  At the end of group, patient expressed that he felt "calm."    Therapeutic Modalities: Solution Focused Brief Therapy Activity   Ambrose Mantle, LCSW

## 2021-01-13 NOTE — Progress Notes (Signed)
   01/12/21 2100  Psych Admission Type (Psych Patients Only)  Admission Status Involuntary  Psychosocial Assessment  Patient Complaints Other (Comment) (pt c/ medications he feels he is taking too much depakote)  Eye Contact Brief  Facial Expression Flat  Affect Blunted  Speech Argumentative  Interaction Guarded;Defensive  Motor Activity Other (Comment);Slow  Appearance/Hygiene Disheveled;Poor hygiene  Behavior Characteristics Appropriate to situation  Mood Pleasant;Preoccupied  Thought Process  Coherency Circumstantial;Tangential  Content Paranoia;Blaming others;Preoccupation  Delusions Paranoid  Perception Derealization  Hallucination None reported or observed  Judgment Poor  Confusion None  Danger to Self  Current suicidal ideation? Denies  Danger to Others  Danger to Others None reported or observed

## 2021-01-13 NOTE — Progress Notes (Signed)
[facility] MD Progress Note  01/13/2021 12:00 PM Benjamin Simpson  MRN:  [id number] Subjective:  Patient is a 30 year old male with a reported negative past psychiatric history who presented to the [facility] on 01/09/2021 on a voluntary basis.  His chief complaint at that time was "they told me to come here for an evaluation".  The officer advised that his mother was going to the magistrate to place him under involuntary commitment.  He had been petitioned there by his mother secondary to aggressive behavior and paranoia.  Objective: Patient is seen and examined.  Patient is a 30 year old male with the above-stated past psychiatric history who is seen in follow-up.  He seems to be less irritable and argumentative today.  He was started on Depakote yesterday.  Last night he told the staff that the dosage was "too high".  Nursing staff relates that he did take the medication.  He slept 6.5 hours.  He denied any suicidal or homicidal ideation.  He denied any auditory or visual hallucinations.  He still remains delusional.  He stated that his mother is not his mother.  He stated he called his true mother yesterday, but he stated she would not pick up the phone, and then he stated he had told her not to pick up the phone.  We discussed where he would stay after discharge.  He stated he had been staying at several hotels, and that his plan was to return to [place].  He stated he had relatives there who would pay for his take it there.  He had previously denied that he had any physical illness, and we obtained an HIV which was positive.  He continues to contradict himself multiple times.  I did ask him to provide the mother's phone number, and he is very hesitant about that.  I looked up in his hard chart, and found the phone number of his biological mother who involuntarily committed him.  I attempted to call her yesterday, and I did leave a message but we have not heard back from her at  least at this point.  His vital signs are stable, he is afebrile.  Pulse oximetry on room air is 99%.  He slept 6.5 hours last night.  Review of his laboratories revealed him to be HIV positive quantitatively with a score of 40.  The HIV RNA on the log 10 scale was 1.6.  Urinalysis from 6/4 was negative.  Principal Problem: Schizophrenia, paranoid (HCC) Diagnosis: Principal Problem:   Schizophrenia, paranoid (HCC) Active Problems:   Paranoid schizophrenia (HCC)  Total Time spent with patient: 20 minutes  Past Psychiatric History: See admission H&P  Past Medical History: History reviewed. No pertinent past medical history. History reviewed. No pertinent surgical history. Family History: History reviewed. No pertinent family history. Family Psychiatric  History: See admission H&P Social History:  Social History   Substance and Sexual Activity  Alcohol Use Not Currently     Social History   Substance and Sexual Activity  Drug Use Not Currently    Social History   Socioeconomic History  . Marital status: Single    Spouse name: Not on file  . Number of children: Not on file  . Years of education: Not on file  . Highest education level: Not on file  Occupational History  . Not on file  Tobacco Use  . Smoking status: Current Every Day Smoker    Packs/day: 1.00    Years: 10.00  Pack years: 10.00  . Smokeless tobacco: Never Used  . Tobacco comment: Patient refused  Vaping Use  . Vaping Use: Never used  Substance and Sexual Activity  . Alcohol use: Not Currently  . Drug use: Not Currently  . Sexual activity: Not on file  Other Topics Concern  . Not on file  Social History Narrative  . Not on file   Social Determinants of Health   Financial Resource Strain: Not on file  Food Insecurity: Not on file  Transportation Needs: Not on file  Physical Activity: Not on file  Stress: Not on file  Social Connections: Not on file   Additional Social History:                          Sleep: Good  Appetite:  Good  Current Medications: Current Facility-Administered Medications  Medication Dose Route Frequency Provider Last Rate Last Admin  . acetaminophen (TYLENOL) tablet 650 mg  650 mg Oral Q6H PRN Novella Oliveolby, Karen R, NP      . alum & mag hydroxide-simeth (MAALOX/MYLANTA) 200-200-20 MG/5ML suspension 30 mL  30 mL Oral Q4H PRN Novella Oliveolby, Karen R, NP      . divalproex (DEPAKOTE) DR tablet 500 mg  500 mg Oral QHS Antonieta Pertlary, Abbegayle Denault Lawson, MD   500 mg at 01/12/21 2047  . feeding supplement (ENSURE ENLIVE / ENSURE PLUS) liquid 237 mL  237 mL Oral BID BM Antonieta Pertlary, Akeia Perot Lawson, MD   237 mL at 01/13/21 0947  . hydrOXYzine (ATARAX/VISTARIL) tablet 25 mg  25 mg Oral TID PRN Novella Oliveolby, Karen R, NP   25 mg at 01/11/21 2130  . OLANZapine zydis (ZYPREXA) disintegrating tablet 10 mg  10 mg Oral Q8H PRN Antonieta Pertlary, Mitali Shenefield Lawson, MD       And  . LORazepam (ATIVAN) tablet 1 mg  1 mg Oral Q6H PRN Antonieta Pertlary, Keylen Eckenrode Lawson, MD       And  . ziprasidone (GEODON) injection 20 mg  20 mg Intramuscular Q6H PRN Antonieta Pertlary, Alera Quevedo Lawson, MD      . magnesium hydroxide (MILK OF MAGNESIA) suspension 30 mL  30 mL Oral Daily PRN Novella Oliveolby, Karen R, NP      . OLANZapine (ZYPREXA) tablet 5 mg  5 mg Oral Daily Novella Oliveolby, Karen R, NP   5 mg at 01/13/21 [id number]  . OLANZapine zydis (ZYPREXA) disintegrating tablet 10 mg  10 mg Oral QHS Novella Oliveolby, Karen R, NP   10 mg at 01/12/21 2047  . pantoprazole (PROTONIX) EC tablet 40 mg  40 mg Oral Daily Antonieta Pertlary, Valen Gillison Lawson, MD   40 mg at 01/13/21 [id number]  . traZODone (DESYREL) tablet 50 mg  50 mg Oral QHS PRN Novella Oliveolby, Karen R, NP   50 mg at 01/11/21 2130    Lab Results:  Results for orders placed or performed during the hospital encounter of 01/10/21 (from the past 48 hour(s))  HIV-1 RNA quant-no reflex-bld     Status: None   Collection Time: 01/11/21  6:23 PM  Result Value Ref Range   HIV 1 RNA Quant 40 copies/mL    Comment: (NOTE) The reportable range for this assay is 20 to 10,000,000 copies HIV-1  RNA/mL.    LOG10 HIV-1 RNA 1.602 log10copy/mL    Comment: (NOTE) Performed At: [facility] [address] [place], [place] [id number] Jolene SchimkeNagendra Sanjai MD QI:[id number]:[phone]   TSH     Status: None   Collection Time: 01/11/21  6:23 PM  Result Value Ref Range  TSH 0.458 0.350 - 4.500 uIU/mL    Comment: Performed by a 3rd Generation assay with a functional sensitivity of <=0.01 uIU/mL. Performed at [facility], [address], [place], [place] [address]   Comprehensive metabolic panel     Status: Abnormal   Collection Time: 01/11/21  6:23 PM  Result Value Ref Range   Sodium 139 135 - 145 mmol/L   Potassium 3.8 3.5 - 5.1 mmol/L   Chloride 103 98 - 111 mmol/L   CO2 27 22 - 32 mmol/L   Glucose, Bld 131 (H) 70 - 99 mg/dL    Comment: Glucose reference range applies only to samples taken after fasting for at least 8 hours.   BUN 12 6 - 20 mg/dL   Creatinine, Ser 4.43 0.61 - 1.24 mg/dL   Calcium 9.8 8.9 - 15.4 mg/dL   Total Protein 7.9 6.5 - 8.1 g/dL   Albumin 4.5 3.5 - 5.0 g/dL   AST 22 15 - 41 U/L   ALT 20 0 - 44 U/L   Alkaline Phosphatase 55 38 - 126 U/L   Total Bilirubin 1.9 (H) 0.3 - 1.2 mg/dL   GFR, Estimated >00 >86 mL/min    Comment: (NOTE) Calculated using the CKD-EPI Creatinine Equation (2021)    Anion gap 9 5 - 15    Comment: Performed at [facility], [address] [place], [place] [address]  Urinalysis, Complete w Microscopic Urine, Random     Status: Abnormal   Collection Time: 01/12/21  1:59 PM  Result Value Ref Range   Color, Urine YELLOW (A) YELLOW   APPearance CLEAR (A) CLEAR   Specific Gravity, Urine 1.020 1.005 - 1.030   pH 5.5 5.0 - 8.0   Glucose, UA NEGATIVE NEGATIVE mg/dL   Hgb urine dipstick NEGATIVE NEGATIVE   Bilirubin Urine NEGATIVE NEGATIVE   Ketones, ur NEGATIVE NEGATIVE mg/dL   Protein, ur NEGATIVE NEGATIVE mg/dL   Nitrite NEGATIVE NEGATIVE   Leukocytes,Ua NEGATIVE NEGATIVE    Comment: Performed at  [facility], [address] [place], [place] [address]    Blood Alcohol level:  No results found for: [facility]  Metabolic Disorder Labs: No results found for: HGBA1C, MPG No results found for: PROLACTIN No results found for: CHOL, TRIG, HDL, CHOLHDL, VLDL, LDLCALC  Physical Findings: AIMS:  , ,  ,  ,    CIWA:    COWS:     Musculoskeletal: Strength & Muscle Tone: within normal limits Gait & Station: normal Patient leans: N/A  Psychiatric Specialty Exam:  Presentation  General Appearance: Appropriate for Environment  Eye Contact:Fair  Speech:Normal Rate  Speech Volume:Increased  Handedness:Right   Mood and Affect  Mood:Dysphoric  Affect:Congruent   Thought Process  Thought Processes:Goal Directed  Descriptions of Associations:Circumstantial  Orientation:Full (Time, Place and Person)  Thought Content:Delusions  History of Schizophrenia/Schizoaffective disorder:Yes  Duration of Psychotic Symptoms:Greater than six months  Hallucinations:Hallucinations: None  Ideas of Reference:Delusions  Suicidal Thoughts:Suicidal Thoughts: No  Homicidal Thoughts:Homicidal Thoughts: No   Sensorium  Memory:Immediate Poor; Immediate Fair; Recent Fair; Remote Poor  Judgment:Impaired  Insight:Lacking   Executive Functions  Concentration:Fair  Attention Span:Fair  Recall:Poor  Fund of Knowledge:Fair  Language:Fair   Psychomotor Activity  Psychomotor Activity:Psychomotor Activity: Increased   Assets  Assets:Desire for Improvement; Resilience   Sleep  Sleep:Sleep: Good Number of Hours of Sleep: 6.5    Physical Exam: Physical Exam Vitals and nursing note reviewed.  Constitutional:      Appearance:  Normal appearance.  HENT:     Head: Normocephalic and atraumatic.  Pulmonary:     Effort: Pulmonary effort is normal.  Neurological:     General: No focal deficit present.     Mental Status: He is alert and oriented to person,  place, and time.    Review of Systems  All other systems reviewed and are negative.  Blood pressure 115/79, pulse 69, temperature 97.7 F (36.5 C), temperature source Oral, resp. rate 16, height 6' (1.829 m), weight 68 kg, SpO2 99 %. Body mass index is 20.34 kg/m.   Treatment Plan Summary: Daily contact with patient to assess and evaluate symptoms and progress in treatment, Medication management and Plan : Patient is seen and examined.  Patient is a 30 year old male with the above-stated past psychiatric history who is seen in follow-up.   Diagnosis: 1.  Reported history of schizophrenia versus schizoaffective disorder; bipolar type 2.  Reportedly HIV positive  Pertinent findings on examination today: 1.  He stated he contacted his mother by telephone, but he told me that he informed her not to pick up the phone any longer. 2.  I attempted to call his mother yesterday on the phone number that was in the involuntary commitment paperwork.  I left a message, but have not heard back. 3.  His HIV came back positive. 4.  His irritability has decreased to a degree. 5.  He continues with paranoia and delusional thinking, but somewhat less problematic than yesterday. 6.  He denied auditory or visual loose Nations.  He denied suicidal or homicidal ideation.  Plan: 1.  Continue Depakote 500 mg p.o. nightly for mood stability. 2.  Continue olanzapine 5 mg p.o. daily and 10 mg p.o. nightly for mood stability and psychosis. 3.  Continue hydroxyzine 25 mg p.o. every 6 hours as needed anxiety. 4.  Continue Protonix 40 mg p.o. daily for gastric protection. 5.  Continue trazodone 50 mg p.o. nightly as needed insomnia. 6.  We will continue to attempt to get in touch with family members with regard to his history and perhaps medications he may have received in the past. 7.  Disposition planning-in progress.  Antonieta Pert, MD 01/13/2021, 12:00 PM

## 2021-01-13 NOTE — Plan of Care (Signed)
Has been isolative in room but came out for medications. Currently in bed resting. Guarded upon approach and irritable. Denial of treatment need. Otherwise calm. Support and encouragements provided. Safety precautions maintained.

## 2021-01-14 NOTE — Progress Notes (Signed)
Recreation Therapy Notes  Date: 6.6.22 Time: 1000 Location: 500 Hall Dayroom   Group Topic: Coping Skills   Goal Area(s) Addresses: Patient will define what a coping skill is. Patient will work with peer to create a list of healthy coping skills beginning with each letter of the alphabet. Patient will successfully identify positive coping skills they can use post d/c.  Patient will acknowledge benefit(s) of using learned coping skills post d/c.   Intervention: Group work   Activity: Coping A to Z. Patient asked to identify what a coping skill is and when they use them.  Next patients were given a blank worksheet titled "Coping Skills A-Z" and asked to pair up with a peer. Partners were instructed to come up with at least one positive coping skill per letter of the alphabet, addressing a specific challenge (ex: stress, anger, anxiety, depression, grief, doubt, isolation, self-harm/suicidal thoughts, substance use). Patients were given 15 minutes to brainstorm with their peer, before ideas were presented to the large group. Patients and LRT debriefed on the importance of coping skill selection based on situation and back-up plans when a skill tried is not effective.    Education: Pharmacologist, Scientist, physiological, Discharge Planning.    Education Outcome:  Acknowledges education/Verbalizes understanding/In group clarification offered/Additional education needed   Clinical Observations/Feedback: Pt did not attend group session.       Caroll Rancher, LRT/CTRS         Lillia Abed, Amirr Achord A 01/14/2021 1:15 PM

## 2021-01-14 NOTE — Progress Notes (Signed)
Patient attended Wrap Up group and was appropriate. He rated his day as an 8 out of 10 since he had a good day.

## 2021-01-14 NOTE — Progress Notes (Signed)
Pt continues to give false birthday when asked. Per information pt real birthday and name Benjamin Simpson [facility] [birth year] . Pt has given Clinical research associate 3 different birthdays with different months and years since his admission. Pt continues to isolate and not participate on the unit.     01/14/21 2200  Psych Admission Type (Psych Patients Only)  Admission Status Involuntary  Psychosocial Assessment  Patient Complaints Anxiety;Suspiciousness  Eye Contact Brief  Facial Expression Flat  Affect Blunted  Speech Argumentative  Interaction Guarded;Defensive  Motor Activity Slow  Appearance/Hygiene Disheveled;Poor hygiene  Behavior Characteristics Unwilling to participate  Mood Suspicious;Preoccupied  Thought Process  Coherency Circumstantial  Content Paranoia;Preoccupation  Delusions Paranoid  Perception Derealization  Hallucination None reported or observed  Judgment Poor  Confusion None  Danger to Self  Current suicidal ideation? Denies  Danger to Others  Danger to Others None reported or observed

## 2021-01-14 NOTE — [facility] Group Notes (Signed)
[facility] Group Notes:  (Nursing/MHT/Case Management/Adjunct)  Date:  01/14/2021  Time:  10:21 AM  Type of Therapy:  Goal setting group  Participation Level:  Did Not Attend  Participation Quality:    Affect:    Cognitive:    Insight:    Engagement in Group:  Did not attend  Modes of Intervention:  Discussion and Orientation  Summary of Progress/Problems:  He did not attend group after much encouragement.  Norm Parcel Darlynn Ricco 01/14/2021, 10:21 AM

## 2021-01-14 NOTE — Progress Notes (Signed)
[facility] MD Progress Note  01/14/2021 6:49 PM Benjamin Simpson  MRN:  [id number] Subjective:  Patient is a 30 year old male with a reported negative past psychiatric history who presented to the [facility] on [facility] on a voluntary basis. His chief complaint at that time was "they told me to come here for an evaluation". The officer advised that his mother was going to the magistrate to place him under involuntary commitment. He had been petitioned there by his mother secondary to aggressive behavior and paranoia.  Patient is seen and examined.  Patient is a 30 year old male with the above-stated past psychiatric history who is seen in follow-up.  He denied complaint today.  He denied auditory or visual hallucinations.  He denied any suicidal or homicidal ideation.  He denied any side effects to his current medications.  Social work this a.m. stated that he had apparently spoken with his aunt.  He told me this morning it was a cousin.  He stated that the cousin told him to tell us not to call them.  He continues to be circumstantial with regard to the rationale behind his admission.  He continues to be evasive with regards to not understanding anything to do with his psychiatric diagnoses, any reason for him to be in the psychiatric hospital, and as well as HIV positivity.  His behavior on the unit has been appropriate.  He is basically stayed in his room for most of the day, and does come out occasionally.  His appetite is fine.  His sleep is good.  His vital signs are stable, he is afebrile.  Sleep is good.  No new laboratories.  Principal Problem: Schizophrenia, paranoid (HCC) Diagnosis: Principal Problem:   Schizophrenia, paranoid (HCC) Active Problems:   Paranoid schizophrenia (HCC)  Total Time spent with patient: 20 minutes  Past Psychiatric History: See admission H&P  Past Medical History: History reviewed. No pertinent past medical history. History reviewed. No  pertinent surgical history. Family History: History reviewed. No pertinent family history. Family Psychiatric  History: See admission H&P Social History:  Social History   Substance and Sexual Activity  Alcohol Use Not Currently     Social History   Substance and Sexual Activity  Drug Use Not Currently    Social History   Socioeconomic History  . Marital status: Single    Spouse name: Not on file  . Number of children: Not on file  . Years of education: Not on file  . Highest education level: Not on file  Occupational History  . Not on file  Tobacco Use  . Smoking status: Current Every Day Smoker    Packs/day: 1.00    Years: 10.00    Pack years: 10.00  . Smokeless tobacco: Never Used  . Tobacco comment: Patient refused  Vaping Use  . Vaping Use: Never used  Substance and Sexual Activity  . Alcohol use: Not Currently  . Drug use: Not Currently  . Sexual activity: Not on file  Other Topics Concern  . Not on file  Social History Narrative  . Not on file   Social Determinants of Health   Financial Resource Strain: Not on file  Food Insecurity: Not on file  Transportation Needs: Not on file  Physical Activity: Not on file  Stress: Not on file  Social Connections: Not on file   Additional Social History:  Sleep: Good  Appetite:  Good  Current Medications: Current Facility-Administered Medications  Medication Dose Route Frequency Provider Last Rate Last Admin  . acetaminophen (TYLENOL) tablet 650 mg  650 mg Oral Q6H PRN Novella Olive, NP      . alum & mag hydroxide-simeth (MAALOX/MYLANTA) 200-200-20 MG/5ML suspension 30 mL  30 mL Oral Q4H PRN Novella Olive, NP      . divalproex (DEPAKOTE) DR tablet 500 mg  500 mg Oral QHS Antonieta Pert, MD   500 mg at 01/13/21 2118  . feeding supplement (ENSURE ENLIVE / ENSURE PLUS) liquid 237 mL  237 mL Oral BID BM Antonieta Pert, MD   237 mL at 01/13/21 2000  . hydrOXYzine  (ATARAX/VISTARIL) tablet 25 mg  25 mg Oral TID PRN Novella Olive, NP   25 mg at 01/11/21 2130  . OLANZapine zydis (ZYPREXA) disintegrating tablet 10 mg  10 mg Oral Q8H PRN Antonieta Pert, MD       And  . LORazepam (ATIVAN) tablet 1 mg  1 mg Oral Q6H PRN Antonieta Pert, MD       And  . ziprasidone (GEODON) injection 20 mg  20 mg Intramuscular Q6H PRN Antonieta Pert, MD      . magnesium hydroxide (MILK OF MAGNESIA) suspension 30 mL  30 mL Oral Daily PRN Novella Olive, NP      . OLANZapine (ZYPREXA) tablet 5 mg  5 mg Oral Daily Novella Olive, NP   5 mg at 01/14/21 0741  . OLANZapine zydis (ZYPREXA) disintegrating tablet 10 mg  10 mg Oral QHS Novella Olive, NP   10 mg at 01/13/21 2118  . pantoprazole (PROTONIX) EC tablet 40 mg  40 mg Oral Daily Antonieta Pert, MD   40 mg at 01/14/21 0741  . traZODone (DESYREL) tablet 50 mg  50 mg Oral QHS PRN Novella Olive, NP   50 mg at 01/11/21 2130    Lab Results: No results found for this or any previous visit (from the past 48 hour(s)).  Blood Alcohol level:  No results found for: [facility]  Metabolic Disorder Labs: No results found for: HGBA1C, MPG No results found for: PROLACTIN No results found for: CHOL, TRIG, HDL, CHOLHDL, VLDL, LDLCALC  Physical Findings: AIMS:  , ,  ,  ,    CIWA:    COWS:     Musculoskeletal: Strength & Muscle Tone: within normal limits Gait & Station: normal Patient leans: N/A  Psychiatric Specialty Exam:  Presentation  General Appearance: Fairly Groomed  Eye Contact:Fair  Speech:Normal Rate  Speech Volume:Normal  Handedness:Right   Mood and Affect  Mood:Anxious  Affect:Congruent   Thought Process  Thought Processes:Goal Directed  Descriptions of Associations:Circumstantial  Orientation:Full (Time, Place and Person)  Thought Content:Delusions  History of Schizophrenia/Schizoaffective disorder:Yes  Duration of Psychotic Symptoms:Greater than six  months  Hallucinations:Hallucinations: None  Ideas of Reference:Delusions; Paranoia  Suicidal Thoughts:Suicidal Thoughts: No  Homicidal Thoughts:Homicidal Thoughts: No   Sensorium  Memory:Immediate Fair; Recent Fair  Judgment:Impaired  Insight:Lacking   Executive Functions  Concentration:Good  Attention Span:Good  Recall:Poor  Fund of Knowledge:Fair  Language:Good   Psychomotor Activity  Psychomotor Activity:Psychomotor Activity: Normal   Assets  Assets:Desire for Improvement; Resilience   Sleep  Sleep:Sleep: Good Number of Hours of Sleep: 6.5    Physical Exam: Physical Exam Vitals and nursing note reviewed.  Constitutional:      Appearance: Normal appearance.  HENT:  Head: Normocephalic and atraumatic.  Cardiovascular:     Pulses: Normal pulses.  Neurological:     General: No focal deficit present.     Mental Status: He is alert and oriented to person, place, and time.    ROS Blood pressure 115/79, pulse 69, temperature 97.7 F (36.5 C), temperature source Oral, resp. rate 16, height 6' (1.829 m), weight 68 kg, SpO2 99 %. Body mass index is 20.34 kg/m.   Treatment Plan Summary: Daily contact with patient to assess and evaluate symptoms and progress in treatment, Medication management and Plan : Patient is seen and examined.  Patient is a 30 year old male with the above-stated past psychiatric history who is seen in follow-up.    Diagnosis: 1. Reported history of schizophrenia versus schizoaffective disorder; bipolar type 2. Reportedly HIV positive  Pertinent findings on examination today: 1.  Patient continues to deny auditory or visual hallucinations. 2.  Patient continues to avoid allowing [place] to contact any family members. 3.  Patient continues to deny suicidal or homicidal ideation. 4.  Behavior on the unit has been appropriate.  Plan: 1.  Continue Depakote DR 500 mg p.o. nightly for mood stability. 2.  Continue hydroxyzine 25  mg p.o. 3 times daily as needed anxiety. 3.  Continue Zyprexa 5 mg p.o. daily and 10 mg p.o. nightly for mood stability and psychosis. 4.  Continue Protonix 40 mg p.o. daily for gastric protection. 5.  Continue olanzapine agitation protocol as needed. 6.  Continue trazodone 50 mg p.o. nightly as needed insomnia. 7.  Depakote level, CBC with differential and complete metabolic panel in a.m. 8.  Disposition planning-with our inability to contact any of his family members and his psychiatric situation appearing to be stable at this point we could anticipate discharge in 1 to 2 days as long as everything continues to go well.  We will have minimal input in terms of any kind of follow-up. Antonieta Pert, MD 01/14/2021, 6:49 PM

## 2021-01-14 NOTE — Plan of Care (Signed)
  Problem: Activity: Goal: Interest or engagement in activities will improve Outcome: Not Progressing   Problem: Activity: Goal: Sleeping patterns will improve Outcome: Progressing   Problem: Coping: Goal: Ability to demonstrate self-control will improve Outcome: Progressing

## 2021-01-14 NOTE — [facility] Group Notes (Signed)
LCSW Group Therapy Notes  Type of Therapy and Topic: Group Therapy: Healthy Vs. Unhealthy Coping Strategies  Date and Time: 01/14/21 at 1:00PM  Participation Level: [facility] PARTICIPATION LEVEL: Active  Description of Group:  In this group, patients will be encouraged to explore their healthy and unhealthy coping strategics. Coping strategies are actions that we take to deal with stress, problems, or uncomfortable emotions in our daily lives. Each patient will be challenged to read some scenarios and discuss the unhealthy and healthy coping strategies within those scenarios. Also, each patient will be challenged to describe current healthy and unhealthy strategies that they use in their own lives and discuss the outcomes and barriers to those strategies. This group will be process-oriented, with patients participating in exploration of their own experiences as well as giving and receiving support and challenge from other group members.  Therapeutic Goals: 1. Patient will identify personal healthy and unhealthy coping strategies. 2. Patient will identify healthy and unhealthy coping strategies, in others, through scenarios.  3. Patient will identify expected outcomes of healthy and unhealthy coping strategies. 4. Patient will identify barriers to using healthy coping strategies.   Summary of Patient Progress: Pt shared that he likes to take showers as a form of self-care   Therapeutic Modalities:  Cognitive Behavioral Therapy Solution Focused Therapy Motivational Interviewing    Ruthann Cancer MSW, LCSW Clincal Social Worker  [facility]

## 2021-01-15 DIAGNOSIS — B20 Human immunodeficiency virus [HIV] disease: Secondary | ICD-10-CM

## 2021-01-15 DIAGNOSIS — F200 Paranoid schizophrenia: Secondary | ICD-10-CM | POA: Diagnosis not present

## 2021-01-15 LAB — HIV-1 RNA QUANT-NO REFLEX-BLD
HIV 1 RNA Quant: 40 copies/mL
LOG10 HIV-1 RNA: 1.602 log10copy/mL

## 2021-01-15 MED ORDER — TRAZODONE HCL 50 MG PO TABS
50.0000 mg | ORAL_TABLET | Freq: Every day | ORAL | Status: DC
  Administered 2021-01-15: 50 mg via ORAL
  Filled 2021-01-15: qty 7
  Filled 2021-01-15 (×2): qty 1

## 2021-01-15 MED ORDER — OLANZAPINE 5 MG PO TBDP
15.0000 mg | ORAL_TABLET | Freq: Every day | ORAL | Status: DC
  Administered 2021-01-15: 15 mg via ORAL
  Filled 2021-01-15 (×4): qty 1

## 2021-01-15 MED ORDER — BICTEGRAVIR-EMTRICITAB-TENOFOV 50-200-25 MG PO TABS
1.0000 | ORAL_TABLET | Freq: Every day | ORAL | Status: DC
  Administered 2021-01-15 – 2021-01-16 (×2): 1 via ORAL
  Filled 2021-01-15: qty 7
  Filled 2021-01-15 (×4): qty 1

## 2021-01-15 NOTE — [facility] Suicide Risk Assessment (Signed)
[facility] INPATIENT:  Family/Significant Other Suicide Prevention Education  Suicide Prevention Education:  Education Completed; mother, [name] [phone]), has been identified by the patient as the family member/significant other with whom the patient will be residing, and identified as the person(s) who will aid the patient in the event of a mental health crisis (suicidal ideations/suicide attempt).  With written consent from the patient, the family member/significant other has been provided the following suicide prevention education, prior to the and/or following the discharge of the patient.   CSW spoke with this patients mother who stated that this patient has not been taking their medications for sometime. She states that this patient is currently homeless and not taking care of self.   Pt's mother states that this patient has been wanting to return to [place] where he used to live, however mother states that pt experienced different forms of trauma while living their and does not feel that it is best for this patient to return. She feels that patient wants to return to [place] due to mother trying to encourage him to take medications and take care of self.    The suicide prevention education provided includes the following:  Suicide risk factors  Suicide prevention and interventions  National Suicide Hotline telephone number  [facility] assessment telephone number  [facility] Emergency Assistance 911  [facility] and/or Residential Mobile Crisis Unit telephone number  Request made of family/significant other to:  Remove weapons (e.g., guns, rifles, knives), all items previously/currently identified as safety concern.    Remove drugs/medications (over-the-counter, prescriptions, illicit drugs), all items previously/currently identified as a safety concern.  The family member/significant other verbalizes understanding of the suicide prevention education information  provided.  The family member/significant other agrees to remove the items of safety concern listed above.  Esli Jernigan A Johnmatthew Solorio 01/15/2021, 11:48 AM

## 2021-01-15 NOTE — Progress Notes (Signed)
Patient allowed Dr. And nurse to look in his wallet at various forms of ID to correct his birthday and to positively identify pt. Pt. Had been telling some providers that his last name was [place]. Pt. Admitted that his last name is [place] and his birth date is 08/29/1990. Pt. Was informed that he is positive for HIV, pt.admitted that he knew he was positive and that he takes [place].Pt. admitted that he was not giving the correct information because he didn't want to be stuck with the bill. Pt.s ID was corrected in ITS.

## 2021-01-15 NOTE — Progress Notes (Signed)
Recreation Therapy Notes  Date: 6.7.22 Time: 0955 Location: 500 Hall  Group Topic: Communication, Team Building, Problem Solving  Goal Area(s) Addresses:  Patient will effectively work with peer towards shared goal.  Patient will identify skills used to make activity successful.  Patient will identify how skills used during activity can be used to reach post d/c goals.   Intervention: Teambuilding Activity  Activity: [employer]. Working in teams, patients were asked to use colored discs to get the entire team from one end of the hall way to the other. Patients were only allowed to move down and back the hallway by stepping on the discs, patient teams were provided 1 additional disc to assist with them completing task.    Education: Pharmacist, community, Scientist, physiological, Discharge Planning   Education Outcome: Acknowledges education.   Clinical Observations/Feedback: Pt did not attend group session.     Caroll Rancher, LRT/CTRS         Caroll Rancher A 01/15/2021 12:38 PM

## 2021-01-15 NOTE — Progress Notes (Signed)
[facility]  01/15/2021 9:11 AM Benjamin Simpson  MRN:  [id number] Subjective:  Patient is a 30 year old male with a reported negative past psychiatric history who [facility] on a voluntary basis. His chief complaint at that time was "they told me to come here for an evaluation". The officer advised that his mother was going to the magistrate to place him under involuntary commitment. He had been petitioned there by his mother secondary to aggressive behavior and paranoia.  Patient is seen and examined.  Patient is a 30 year old male with the above-stated past psychiatric history who is seen in follow-up.  Patient today states that he is not [facility], that he is Benjamin Simpson.  He states that he is [place] in [place], but lives in [place].  He provides me with a phone number for Joycie PeekYolanda Simpson who he states is his actual mother.  Attempts to make contact with the phone number provided yield an incorrect number.  Patient denies any knowledge of the people who placed him under involuntary commitment stating that it is his brothers girlfriends mother who called 911.  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  He states that the medications are making him feel weird, he requests to only stay on Zyprexa, and wonders if he needs a higher dose at bedtime to help him sleep. Patient is provided with his belongings so that we can check his identity with his forms of identification.  Patient does have a valid driver's license for [place], a [employer] card, and his inmate present identification from when he was in prison in [place].  He also has a [employer] insurance card.  At first patient states that those are false documents, but later than admits that he has been concerned of the price of medications and  hospitalization and has been untruthful about his identity.  He admits that Benjamin Simpson is his mother.  Patient also states that he has been taking Biktarvy for his HIV, and is agreeable to restarting medication while here in the hospital.  He is still reluctant to discuss his psychiatric diagnosis, stating he told the prison system in [place] that he had schizophrenia so that he could get Zyprexa for sleep.  He states he was released from prison in July 2021. He denied auditory or visual hallucinations.  He denied any suicidal or homicidal ideation. His behavior on the unit has been appropriate.  He is basically stayed in his room for most of the day, and does come out occasionally.  His appetite is fine.  His sleep is good.  His vital signs are stable, he is afebrile.  Sleep is good.  No new laboratories.  Patient states that he feels he can trust staff now, and is willing to cooperate with staff and social work for discharge planning, and plans to transition to a [facility] [place] [facility] provider.  Principal Problem: Schizophrenia, paranoid (HCC) Diagnosis: Principal Problem:   Schizophrenia, paranoid (HCC) Active Problems:   Paranoid schizophrenia (HCC)  Total Time spent with patient: 40 minutes  Past Psychiatric History: See admission H&P  Past Medical History: History reviewed. No pertinent past medical history. History reviewed. No pertinent surgical history. Family History: History reviewed. No pertinent family history. Family Psychiatric  History: See admission H&P Social History:  Social History   Substance and  Sexual Activity  Alcohol Use Not Currently     Social History   Substance and Sexual Activity  Drug Use Not Currently    Social History   Socioeconomic History  . Marital status: Single    Spouse name: Not on file  . Number of children: Not on file  . Years of education: Not on file  . Highest education level: Not on file  Occupational History  . Not  on file  Tobacco Use  . Smoking status: Current Every Day Smoker    Packs/day: 1.00    Years: 10.00    Pack years: 10.00  . Smokeless tobacco: Never Used  . Tobacco comment: Patient refused  Vaping Use  . Vaping Use: Never used  Substance and Sexual Activity  . Alcohol use: Not Currently  . Drug use: Not Currently  . Sexual activity: Not on file  Other Topics Concern  . Not on file  Social History Narrative  . Not on file   Social Determinants of Health   Financial Resource Strain: Not on file  Food Insecurity: Not on file  Transportation Needs: Not on file  Physical Activity: Not on file  Stress: Not on file  Social Connections: Not on file   Additional Social History:                         Sleep: Good  Appetite:  Good  Current Medications: Current Facility-Administered Medications  Medication Dose Route Frequency Provider Last Rate Last Admin  . acetaminophen (TYLENOL) tablet 650 mg  650 mg Oral Q6H PRN Novella Olive, NP      . alum & mag hydroxide-simeth (MAALOX/MYLANTA) 200-200-20 MG/5ML suspension 30 mL  30 mL Oral Q4H PRN Novella Olive, NP      . divalproex (DEPAKOTE) DR tablet 500 mg  500 mg Oral QHS Antonieta Pert, MD   500 mg at 01/14/21 2053  . feeding supplement (ENSURE ENLIVE / ENSURE PLUS) liquid 237 mL  237 mL Oral BID BM Antonieta Pert, MD   237 mL at 01/13/21 2000  . hydrOXYzine (ATARAX/VISTARIL) tablet 25 mg  25 mg Oral TID PRN Novella Olive, NP   25 mg at 01/11/21 2130  . OLANZapine zydis (ZYPREXA) disintegrating tablet 10 mg  10 mg Oral Q8H PRN Antonieta Pert, MD       And  . LORazepam (ATIVAN) tablet 1 mg  1 mg Oral Q6H PRN Antonieta Pert, MD       And  . ziprasidone (GEODON) injection 20 mg  20 mg Intramuscular Q6H PRN Antonieta Pert, MD      . magnesium hydroxide (MILK OF MAGNESIA) suspension 30 mL  30 mL Oral Daily PRN Novella Olive, NP      . OLANZapine (ZYPREXA) tablet 5 mg  5 mg Oral Daily Novella Olive,  NP   5 mg at 01/15/21 1937  . OLANZapine zydis (ZYPREXA) disintegrating tablet 10 mg  10 mg Oral QHS Novella Olive, NP   10 mg at 01/14/21 2053  . pantoprazole (PROTONIX) EC tablet 40 mg  40 mg Oral Daily Antonieta Pert, MD   40 mg at 01/15/21 9024  . traZODone (DESYREL) tablet 50 mg  50 mg Oral QHS PRN Novella Olive, NP   50 mg at 01/11/21 2130    Lab Results: No results found for this or any previous visit (from the past 48 hour(s)).  Blood  Alcohol level:  No results found for: [facility]  Metabolic Disorder Labs: No results found for: HGBA1C, MPG No results found for: PROLACTIN No results found for: CHOL, TRIG, HDL, CHOLHDL, VLDL, LDLCALC  Physical Findings: AIMS:  , ,  ,  ,    CIWA:    COWS:     Musculoskeletal: Strength & Muscle Tone: within normal limits Gait & Station: normal Patient leans: N/A  Psychiatric Specialty Exam:  Presentation  General Appearance: Fairly Groomed  Eye Contact:Fair  Speech:Normal Rate  Speech Volume:Normal  Handedness:Right   Mood and Affect  Mood:Anxious  Affect:Congruent   Thought Process  Thought Processes:Goal Directed  Descriptions of Associations:Circumstantial  Orientation:Full (Time, Place and Person)  Thought Content:Delusions  History of Schizophrenia/Schizoaffective disorder:Yes  Duration of Psychotic Symptoms:Greater than six months  Hallucinations:Hallucinations: None  Ideas of Reference:Delusions; Paranoia  Suicidal Thoughts:Suicidal Thoughts: No  Homicidal Thoughts:Homicidal Thoughts: No   Sensorium  Memory:Immediate Fair; Recent Fair  Judgment:Impaired  Insight:Lacking   Executive Functions  Concentration:Good  Attention Span:Good  Recall:Poor  Fund of Knowledge:Fair  Language:Good   Psychomotor Activity  Psychomotor Activity:Psychomotor Activity: Normal   Assets  Assets:Desire for Improvement; Resilience   Sleep  Sleep:Sleep: Good Number of Hours of Sleep:  6.5    Physical Exam: Physical Exam Vitals and nursing note reviewed.  Constitutional:      Appearance: Normal appearance.  HENT:     Head: Normocephalic and atraumatic.  Eyes:     Extraocular Movements: Extraocular movements intact.  Cardiovascular:     Pulses: Normal pulses.  Musculoskeletal:        General: Normal range of motion.     Cervical back: Normal range of motion.  Neurological:     General: No focal deficit present.     Mental Status: He is alert and oriented to person, place, and time.    Review of Systems  Constitutional: Negative.   Respiratory: Negative.   Cardiovascular: Negative.   Gastrointestinal: Negative.   Musculoskeletal: Negative.   Neurological: Negative.   Psychiatric/Behavioral: Negative for depression, hallucinations, memory loss, substance abuse and suicidal ideas. The patient is not nervous/anxious and does not have insomnia.    Blood pressure 118/83, pulse 92, temperature 98.4 F (36.9 C), temperature source Oral, resp. rate 16, height 6' (1.829 m), weight 68 kg, SpO2 99 %. Body mass index is 20.34 kg/m.   Treatment Plan Summary: Daily contact with patient to assess and evaluate symptoms and progress in treatment, Medication management and Plan : Patient is seen and examined.  Patient is a 30 year old male with the above-stated past psychiatric history who is seen in follow-up.    Diagnosis: 1. Reported history of schizophrenia versus schizoaffective disorder; bipolar type 2. HIV positive, takes Biktarvy  Pertinent findings on examination today: 1.  Patient continues to deny auditory or visual hallucinations. 2.  Patient is willing to allow coordination with family members for discharge planning, however he does wish to continue to live independently. 3.  Patient continues to deny suicidal or homicidal ideation. 4.  Behavior on the unit has been appropriate.  Plan: 1.  Discontinue Depakote DR 500 mg p.o. nightly for mood stability  and continue to monitor for worsening mood symptoms. 2.  Discontinue hydroxyzine 25 mg p.o. 3 times daily as needed anxiety per patient request due to side effect of sedation. 3.  Discontinue Zyprexa 5 mg p.o. daily and increase Zyprexa to 15 mg p.o. nightly for mood stability and psychosis. 4.  Discontinue Protonix 40 mg p.o. daily  for gastric protection, patient without complaints and requests to not take 5.  Continue olanzapine agitation protocol as needed. 6.  Discontinue trazodone 50 mg p.o. nightly as needed insomnia, patient has not taken. 7.  Repeat Depakote level, CBC with differential and complete metabolic panel were not obtained 8.  Disposition planning-patient now willing for social work to contact any of his family members.  He has been stable on the unit, per record review we could anticipate discharge in 1 to 2 days as long as everything continues to go well.     Mariel Craft, MD 01/15/2021, 9:11 AM

## 2021-01-15 NOTE — Progress Notes (Signed)
   01/15/21 0500  Sleep  Number of Hours 6.5

## 2021-01-15 NOTE — Progress Notes (Addendum)
Pt noted to be guarded with blunted affect, irritable mood and is watchful /suspicious and paranoid on interactions. He remains isolative to room majority of this shift. Out of bed for medications and off unit recreational activity with peers only this shift with multiple prompts. Attempted to cheek his scheduled medications (Zyprexa & Protonix) under his tongue this morning and was caught by Clinical research associate upon mouth checks. Pt did not attend scheduled groups despite multiple prompts.  Support and encouragement provided to pt. All medications given as ordered with verbal education and effects monitored. Q 15 minutes checks maintained without self harm gestures or outburst to note thus far.  Pt tolerates meal fairly but is sleeping well. Denies SI, HI, AVH and pain at this time. Remains safe on and off unit without concerns thus far this shift.

## 2021-01-15 NOTE — Progress Notes (Signed)
Adult Psychoeducational Group Note  Date:  01/15/2021 Time:  10:26 PM  Group Topic/Focus:  Wrap-Up Group:   The focus of this group is to help patients review their daily goal of treatment and discuss progress on daily workbooks.  Participation Level:  Minimal  Participation Quality:  Appropriate  Affect:  Anxious and Depressed  Cognitive:  Appropriate  Insight: Limited  Engagement in Group:  Limited  Modes of Intervention:  Discussion  Additional Comments:  Pt stated his goal for today was to focus on his treatment plan. Pt stated he accomplished his goal today. Pt stated he talk with his doctorand hissocial worker about his care today. Pt rated his overall day a 10. Pt stated he talk with his moother today which improved his day.Pt stated he took all medications provided today. Pt stated his appetite was fairtoday. Pt rated sleep last night as good. Pt stated the goal tonight was to get some rest. Pt stated he hadnophysical pain today. Pt deny visual hallucinationsand auditoryissues tonight. Pt denies thoughts of harming himself and others. Pt stated he would alert staff if anything changed.   [name] 01/15/2021, 10:26 PM

## 2021-01-15 NOTE — Progress Notes (Signed)
   01/15/21 2300  COVID-19 Daily Checkoff  Have you had a fever (temp > 37.80C/100F)  in the past 24 hours?  No  If you have had runny nose, nasal congestion, sneezing in the past 24 hours, has it worsened? No

## 2021-01-15 NOTE — Progress Notes (Signed)
Pt did not attend orientation group.  

## 2021-01-15 NOTE — Progress Notes (Signed)
   01/15/21 2302  Psych Admission Type (Psych Patients Only)  Admission Status Involuntary  Psychosocial Assessment  Patient Complaints None  Eye Contact Avertive  Facial Expression Flat  Affect Blunted  Speech Argumentative  Interaction Guarded;Defensive  Motor Activity Slow  Appearance/Hygiene Disheveled;Poor hygiene  Behavior Characteristics Cooperative;Guarded  Mood Suspicious  Thought Process  Coherency Circumstantial  Content Paranoia;Preoccupation  Delusions Paranoid  Perception Derealization  Hallucination None reported or observed  Judgment Poor  Confusion None  Danger to Self  Current suicidal ideation? Denies  Danger to Others  Danger to Others None reported or observed  D: Patient mostly in his room this evening. Pt appears guarded when talking to Clinical research associate but reported he is getting better.  A: Medications administered as prescribed. Support and encouragement provided as needed.  R: Patient remains safe on the unit. Will continue to monitor for safety and stability.

## 2021-01-16 MED ORDER — OLANZAPINE 15 MG PO TBDP
15.0000 mg | ORAL_TABLET | Freq: Every day | ORAL | Status: DC
  Filled 2021-01-16: qty 7

## 2021-01-16 MED ORDER — OLANZAPINE 15 MG PO TBDP: 15.0000 mg | ORAL_TABLET | Freq: Every day | ORAL | 0 refills | Status: DC

## 2021-01-16 MED ORDER — BICTEGRAVIR-EMTRICITAB-TENOFOV 50-200-25 MG PO TABS: 1.0000 | ORAL_TABLET | Freq: Every day | ORAL | 0 refills | Status: DC

## 2021-01-16 MED ORDER — TRAZODONE HCL 50 MG PO TABS: 50.0000 mg | ORAL_TABLET | Freq: Every day | ORAL | 0 refills | Status: DC

## 2021-01-16 NOTE — Discharge Summary (Signed)
Physician Discharge Summary Note  Patient:  Benjamin Simpson is an 30 y.o., male  MRN:  [id number]  DOB:  1990/10/11  Patient phone:  [phone] (home)   Patient address:   [address] [place] [address],   Total Time spent with patient: Greater than 30 minutes  Date of Admission:  01/10/2021  Date of Discharge: 01-16-21  Reason for Admission: Worsening symptoms of Schizophrenia, medication no-compliance & not attending to personal hygiene.  Principal Problem: Schizophrenia, paranoid [facility])  Discharge Diagnoses: Principal Problem:   Schizophrenia, paranoid (HCC) Active Problems:   Paranoid schizophrenia (HCC)  Past Psychiatric History: Paranoid Schizophrenia  Past Medical History: History reviewed. No pertinent past medical history. History reviewed. No pertinent surgical history.  Family History: History reviewed. No pertinent family history.  Family Psychiatric  History: See H&P  Social History:  Social History   Substance and Sexual Activity  Alcohol Use Not Currently   Comment: twice a week     Social History   Substance and Sexual Activity  Drug Use Not Currently  . Types: Marijuana    Social History   Socioeconomic History  . Marital status: Single    Spouse name: Not on file  . Number of children: Not on file  . Years of education: Not on file  . Highest education level: Not on file  Occupational History  . Not on file  Tobacco Use  . Smoking status: Current Every Day Smoker    Packs/day: 1.00    Years: 10.00    Pack years: 10.00    Types: Cigarettes  . Smokeless tobacco: Never Used  . Tobacco comment: Patient refused  Vaping Use  . Vaping Use: Never used  Substance and Sexual Activity  . Alcohol use: Not Currently    Comment: twice a week  . Drug use: Not Currently    Types: Marijuana  . Sexual activity: Never  Other Topics Concern  . Not on file  Social History Narrative   ** Merged History Encounter **       Social  Determinants of Health   Financial Resource Strain: Not on file  Food Insecurity: Not on file  Transportation Needs: Not on file  Physical Activity: Not on file  Stress: Not on file  Social Connections: Not on file   Hospital Course: (Per Md's admission evaluation notes): Patient is a 30 year old male with reportedly a negative past psychiatric history who presented to the [facility] on 01/09/2021 on a voluntary basis. His chief complaint at that time was "they told me to come here for an evaluation". He stated at that time that his mother had been trying to get him out of the house for "a while". The officer advised that his mother was going to go to the magistrate to place him under involuntary commitment. The comprehensive clinical assessment team saw the patient on 01/09/2021. He had been petitioned there by his mother. The patient denied all behavioral health history, and stated he had been referred for an assessment. He denied any current psychiatric medications. He denied any suicidal ideation, homicidal ideation or auditory or visual hallucinations. He denied any history of violence. He stated that he had moved from [place] to [place] for "vacation". According to collateral information that the patient lives with his mother and brother with limited additional supports. 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 He denied any legal charges. He stated that he has "a marijuana card  from [place]". Collateral information from the mother reported that the patient has a history of "pretending" everything was okay. He reportedly has a history of schizophrenia versus bipolar disorder and had been noncompliant with his medications. The patient had apparently not taken his medications in over a month. He had not been tending to his personal hygiene, and was using NyQuil to sleep at night. It was reported that he  hallucinates and will turn and look at people as though he was responding to internal stimuli. It was also noted that he got into an argument with his brother and had been very combative. Psychiatric consultation with the nurse practitioner was obtained on 01/09/2021. He denied any psychiatric history, and also apparently denied that he was HIV positive. On reevaluation on 01/10/2021 no additional information was really obtained. It also does not appear as though they requested any records from [place]. He had refused laboratories at the behavioral health urgent care center and was sent to the [facility] for laboratories. We have minimal results at least at this point. His influenza panel was negative. Urinalysis was negative. Drug screen was positive for marijuana. EKG showed a sinus arrhythmia with a normal QTc interval. We have no other information on him currently. He is vague and provides no additional history. The only other documentation in the chart is from 2013 when he was having GERD symptoms. His transfer medications on admission included Zyprexa 5 mg p.o. nightly and agitation protocol.   Prior to this discharge, Estes was seen & evaluated for mental health stability. The current laboratory findings were reviewed (stable), nurses notes & vital signs were reviewed as well. There are no current mental health or medical issues that should prevent this discharge at this time. Patient is being discharged to continue mental health care as noted below.  After evaluation of his presenting symptoms, it was jointly agreed by the treatment team to recommend [facility] for mood stabilization treatments.The medication regimen targeting those presenting symptoms were discussed & initiated with his consent. He received, stabilized & discharged on the medications as listed below on his discharge medication lists. He was also enrolled & participated in the group counseling sessions being offered &  held on this unit. He learned coping skills. He presented other significant pre-existing medical issues that required treatments & or monitoring. He was treated & discharged on all his pertinent home medications for those health issues. He tolerated his treatment regimen without any adverse effects or reactions reported.   Lijah's symptoms responded well to his treatment regimen warranting this discharge. This is evidenced by his reports of improved mood, absence of psychosis, suicidal ideations & readiness to be discharged to his home with family. He is currently mentally & medically stable for discharge to continue mental health care as recommended below. He was provided with all the pertinent information needed to make this appointment without problems.   Today upon his discharge evaluation with the attending psychiatrist today, Kassem reports he is doing & feeling a lot better than when first admitted to the hospital. He denies any specific concerns. He is sleeping well. His appetite is good. He denies other physical complaints. He denies SI/HI/AH/VH, delusional thoughts or paranoia. He does not appear to be responding to any internal stimuli. He is tolerating his medications well & in agreement to continue his current regimen as recommended. He will follow up for routine psychiatric care & medication management as noted below. He is provided with all the necessary information needed to  make these appointments without problems. He was able to engage in safety planning including [facility] or contact emergency services if he feels unable to maintain his own safety or the safety of others. Pt had no further questions, comments or concerns. [facility] personal belongings in no apparent distress. Transportation per the city bus. [facility] assisted with bus pass.   Physical Findings: AIMS:  , ,  ,  ,    CIWA:    COWS:     Musculoskeletal: Strength & Muscle Tone: within normal limits Gait &  Station: normal Patient leans: N/A  Psychiatric Specialty Exam:  Presentation  General Appearance: Appropriate for Environment; Casual; Fairly Groomed  Eye Contact:Good  Speech:Normal Rate; Clear and Coherent  Speech Volume:Normal  Handedness:Right   Mood and Affect  Mood:Euthymic  Affect:Appropriate; Congruent  Thought Process  Thought Processes:Coherent  Descriptions of Associations:Intact  Orientation:Full (Time, Place and Person)  Thought Content:Logical  History of Schizophrenia/Schizoaffective disorder:Yes  Duration of Psychotic Symptoms:Greater than six months  Hallucinations:Hallucinations: None Description of Auditory Hallucinations: NA  Ideas of Reference:None  Suicidal Thoughts:Suicidal Thoughts: No  Homicidal Thoughts:Homicidal Thoughts: No   Sensorium  Memory:Immediate Good; Recent Good; Remote Good  Judgment:Good  Insight:Good  Executive Functions  Concentration:Good  Attention Span:Good  Recall:Good  Fund of Knowledge:Good  Language:Good  Psychomotor Activity  Psychomotor Activity:Psychomotor Activity: Normal   Assets  Assets:Communication Skills; Housing; Desire for Improvement; Resilience; Social Support  Sleep  Sleep: Good   Physical Exam: Physical Exam Vitals and nursing note reviewed.  HENT:     Head: Normocephalic.     Nose: Nose normal.     Mouth/Throat:     Pharynx: Oropharynx is clear.  Eyes:     Pupils: Pupils are equal, round, and reactive to light.  Cardiovascular:     Rate and Rhythm: Normal rate.     Pulses: Normal pulses.  Pulmonary:     Effort: Pulmonary effort is normal.  Genitourinary:    Comments: Deferred  Musculoskeletal:        General: Normal range of motion.     Cervical back: Normal range of motion.  Skin:    General: Skin is warm and dry.  Neurological:     General: No focal deficit present.     Mental Status: He is alert and oriented to person, place, and time. Mental status is  at baseline.    Review of Systems  Constitutional: Negative.   HENT: Negative.   Eyes: Negative.   Respiratory: Negative.   Cardiovascular: Negative.   Gastrointestinal: Negative.   Genitourinary: Negative.        Hx. HIV (+)  Musculoskeletal: Negative.   Skin: Negative.   Neurological: Negative.   Endo/Heme/Allergies: Negative.   Psychiatric/Behavioral: Positive for hallucinations (Hx. of (stable on medication)) and substance abuse (Hx. THC use disorder). Negative for depression, memory loss and suicidal ideas. The patient has insomnia (Hx. of (stable on medication)). The patient is not nervous/anxious (Stable upon discharge).    Blood pressure 118/83, pulse 92, temperature 98.4 F (36.9 C), temperature source Oral, resp. rate 16, height 6' (1.829 m), weight 68 kg, SpO2 99 %. Body mass index is 20.34 kg/m.  Have you used any form of tobacco in the last 30 days? (Cigarettes, Smokeless Tobacco, Cigars, and/or Pipes): Yes  Has this patient used any form of tobacco in the last 30 days? (Cigarettes, Smokeless Tobacco, Cigars, and/or Pipes): N/A  Blood Alcohol level:  Lab Results  Component Value  Date   ETH <10 01/10/2021    Metabolic Disorder Labs:  No results found for: HGBA1C, MPG No results found for: PROLACTIN No results found for: CHOL, TRIG, HDL, CHOLHDL, VLDL, LDLCALC  See Psychiatric Specialty Exam and Suicide Risk Assessment completed by Attending Physician prior to discharge.  Discharge destination:  Home  Is patient on multiple antipsychotic therapies at discharge:  No   Has Patient had three or more failed trials of antipsychotic monotherapy by history:  No  Recommended Plan for Multiple Antipsychotic Therapies: NA  Allergies as of 01/16/2021   No Known Allergies     Medication List    TAKE these medications     Indication  bictegravir-emtricitabine-tenofovir AF 50-200-25 MG Tabs tablet Commonly known as: BIKTARVY Take 1 tablet by mouth daily. For HIV  information Start taking on: January 17, 2021  Indication: HIV Disease   olanzapine zydis 15 MG disintegrating tablet Commonly known as: ZYPREXA Take 1 tablet (15 mg total) by mouth at bedtime. For mood control What changed:   medication strength  how much to take  additional instructions  Another medication with the same name was removed. Continue taking this medication, and follow the directions you see here.  Indication: Mood control   traZODone 50 MG tablet Commonly known as: DESYREL Take 1 tablet (50 mg total) by mouth at bedtime. For insomnia  Indication: Trouble Sleeping       Follow-up Information    [facility]. Go to.   Why: If you decide you would like to continue services for therapy and medication management please walk-in Monday through Wednesday between 7:45am and 11am.  Contact information: [address] [place] [address]  Telephone: [phone] Fax: [phone]            Follow-up recommendations: Activity:  As tolerated Diet: As recommended by your primary care doctor. Keep all scheduled follow-up appointments as recommended.   Comments: Prescriptions given at discharge.  Patient agreeable to plan.  Given opportunity to ask questions.  Appears to feel comfortable with discharge denies any current suicidal or homicidal thought. Patient is also instructed prior to discharge to: Take all medications as prescribed by his/her mental healthcare provider. Report any adverse effects and or reactions from the medicines to his/her outpatient provider promptly. Patient has been instructed & cautioned: To not engage in alcohol and or illegal drug use while on prescription medicines. In the event of worsening symptoms, patient is instructed to call the crisis hotline, 911 and or go to the nearest ED for appropriate evaluation and treatment of symptoms. To follow-up with his/her primary care provider for your other medical issues,  concerns and or health care needs.  Signed: Armandina Stammer, NP, pmhnp, fnp-bc 01/16/2021, 5:54 PM

## 2021-01-16 NOTE — Progress Notes (Signed)
Recreation Therapy Notes  INPATIENT RECREATION TR PLAN  Patient Details Name: CONSTANCE HACKENBERG MRN: [id number] DOB: 1991-03-23 Today's Date: 01/16/2021  Rec Therapy Plan Is patient appropriate for Therapeutic Recreation?: Yes Treatment times per week: about 3 days Estimated Length of Stay: 5-7 days TR Treatment/Interventions: Group participation (Comment)  Discharge Criteria Pt will be discharged from therapy if:: Discharged Treatment plan/goals/alternatives discussed and agreed upon by:: Patient/family  Discharge Summary Short term goals set: See patient care plan Short term goals met: Not met Reason goals not met: Pt did not attend group sessions. Therapeutic equipment acquired: N/A Reason patient discharged from therapy: Discharge from hospital Pt/family agrees with progress & goals achieved: Yes Date patient discharged from therapy: 01/16/21    Victorino Sparrow, LRT/CTRS   Ria Comment, Loukas Antonson A 01/16/2021, 12:35 PM

## 2021-01-16 NOTE — Progress Notes (Signed)
D: Pt A & O X 3. Denies SI, HI, AVH and pain at this time. Remains guarded, forwards in conversation. D/C home as ordered. Bus passes given for transportation.  A: D/C instructions reviewed with pt including prescriptions, medication samples and follow up appointment, compliance encouraged. All belongings from locker 44 given to pt at time of departure. Scheduled medications given with verbal education and effects monitored. Safety checks maintained without incident till time of d/c.  R: Pt receptive to care. Compliant with medications when offered. Denies adverse drug reactions when assessed. Verbalized understanding related to d/c instructions. Pt signed belonging sheet in agreement with items received from locker. Pt ambulatory with a steady gait. Appears to be in no physical distress at time of departure.

## 2021-01-16 NOTE — Tx Team (Addendum)
Interdisciplinary Treatment and Diagnostic Plan Update  01/16/2021 Time of Session:  Benjamin Simpson MRN: [id number]  Principal Diagnosis: Schizophrenia, paranoid [facility])  Secondary Diagnoses: Principal Problem:   Schizophrenia, paranoid (HCC) Active Problems:   Paranoid schizophrenia (HCC)   Current Medications:  Current Facility-Administered Medications  Medication Dose Route Frequency Provider Last Rate Last Admin  . acetaminophen (TYLENOL) tablet 650 mg  650 mg Oral Q6H PRN Novella Olive, NP      . alum & mag hydroxide-simeth (MAALOX/MYLANTA) 200-200-20 MG/5ML suspension 30 mL  30 mL Oral Q4H PRN Novella Olive, NP      . bictegravir-emtricitabine-tenofovir AF (BIKTARVY) 50-200-25 MG per tablet 1 tablet  1 tablet Oral Daily Mariel Craft, MD   1 tablet at 01/16/21 0825  . feeding supplement (ENSURE ENLIVE / ENSURE PLUS) liquid 237 mL  237 mL Oral BID BM Antonieta Pert, MD   237 mL at 01/15/21 2058  . OLANZapine zydis (ZYPREXA) disintegrating tablet 10 mg  10 mg Oral Q8H PRN Antonieta Pert, MD       And  . LORazepam (ATIVAN) tablet 1 mg  1 mg Oral Q6H PRN Antonieta Pert, MD       And  . ziprasidone (GEODON) injection 20 mg  20 mg Intramuscular Q6H PRN Antonieta Pert, MD      . magnesium hydroxide (MILK OF MAGNESIA) suspension 30 mL  30 mL Oral Daily PRN Novella Olive, NP      . OLANZapine zydis (ZYPREXA) disintegrating tablet 15 mg  15 mg Oral QHS Mariel Craft, MD   15 mg at 01/15/21 2058  . traZODone (DESYREL) tablet 50 mg  50 mg Oral QHS Nira Conn A, NP   50 mg at 01/15/21 2339   PTA Medications: Medications Prior to Admission  Medication Sig Dispense Refill Last Dose  . OLANZapine (ZYPREXA) 5 MG tablet Take 1 tablet (5 mg total) by mouth daily.     [name] OLANZapine zydis (ZYPREXA) 10 MG disintegrating tablet Take 1 tablet (10 mg total) by mouth at bedtime.       Patient Stressors: Financial difficulties Marital or family conflict Medication change  or noncompliance  Patient Strengths: Ability for insight Communication skills Physical Health Supportive family/friends  Treatment Modalities: Medication Management, Group therapy, Case management,  1 to 1 session with clinician, Psychoeducation, Recreational therapy.   Physician Treatment Plan for Primary Diagnosis: Schizophrenia, paranoid (HCC) Long Term Goal(s): Improvement in symptoms so as ready for discharge Improvement in symptoms so as ready for discharge   Short Term Goals: Ability to identify changes in lifestyle to reduce recurrence of condition will improve Ability to verbalize feelings will improve Ability to demonstrate self-control will improve Ability to identify and develop effective coping behaviors will improve Ability to maintain clinical measurements within normal limits will improve Compliance with prescribed medications will improve Ability to identify triggers associated with substance abuse/mental health issues will improve Ability to identify changes in lifestyle to reduce recurrence of condition will improve Ability to verbalize feelings will improve Ability to demonstrate self-control will improve Ability to identify and develop effective coping behaviors will improve Ability to maintain clinical measurements within normal limits will improve Compliance with prescribed medications will improve Ability to identify triggers associated with substance abuse/mental health issues will improve  Medication Management: Evaluate patient's response, side effects, and tolerance of medication regimen.  Therapeutic Interventions: 1 to 1 sessions, Unit Group sessions and Medication administration.  Evaluation of Outcomes: Adequate for  Discharge  Physician Treatment Plan for Secondary Diagnosis: Principal Problem:   Schizophrenia, paranoid (HCC) Active Problems:   Paranoid schizophrenia (HCC)  Long Term Goal(s): Improvement in symptoms so as ready for  discharge Improvement in symptoms so as ready for discharge   Short Term Goals: Ability to identify changes in lifestyle to reduce recurrence of condition will improve Ability to verbalize feelings will improve Ability to demonstrate self-control will improve Ability to identify and develop effective coping behaviors will improve Ability to maintain clinical measurements within normal limits will improve Compliance with prescribed medications will improve Ability to identify triggers associated with substance abuse/mental health issues will improve Ability to identify changes in lifestyle to reduce recurrence of condition will improve Ability to verbalize feelings will improve Ability to demonstrate self-control will improve Ability to identify and develop effective coping behaviors will improve Ability to maintain clinical measurements within normal limits will improve Compliance with prescribed medications will improve Ability to identify triggers associated with substance abuse/mental health issues will improve     Medication Management: Evaluate patient's response, side effects, and tolerance of medication regimen.  Therapeutic Interventions: 1 to 1 sessions, Unit Group sessions and Medication administration.  Evaluation of Outcomes: Adequate for Discharge   RN Treatment Plan for Primary Diagnosis: Schizophrenia, paranoid (HCC) Long Term Goal(s): Knowledge of disease and therapeutic regimen to maintain health will improve  Short Term Goals: Ability to demonstrate self-control, Ability to participate in decision making will improve and Ability to verbalize feelings will improve  Medication Management: RN will administer medications as ordered by provider, will assess and evaluate patient's response and provide education to patient for prescribed medication. RN will report any adverse and/or side effects to prescribing provider.  Therapeutic Interventions: 1 on 1 counseling  sessions, Psychoeducation, Medication administration, Evaluate responses to treatment, Monitor vital signs and CBGs as ordered, Perform/monitor CIWA, COWS, AIMS and Fall Risk screenings as ordered, Perform wound care treatments as ordered.  Evaluation of Outcomes: Adequate for Discharge   LCSW Treatment Plan for Primary Diagnosis: Schizophrenia, paranoid (HCC) Long Term Goal(s): Safe transition to appropriate next level of care at discharge, Engage patient in therapeutic group addressing interpersonal concerns.  Short Term Goals: Engage patient in aftercare planning with referrals and resources, Increase social support and Increase ability to appropriately verbalize feelings  Therapeutic Interventions: Assess for all discharge needs, 1 to 1 time with Social worker, Explore available resources and support systems, Assess for adequacy in community support network, Educate family and significant other(s) on suicide prevention, Complete Psychosocial Assessment, Interpersonal group therapy.  Evaluation of Outcomes: Adequate for Discharge   Progress in Treatment: Attending groups: Yes. Participating in groups: Yes. Taking medication as prescribed: Yes. Toleration medication: Yes. Family/Significant other contact made: Yes, individual(s) contacted:  Mother  Patient understands diagnosis: No. Discussing patient identified problems/goals with staff: Yes. Medical problems stabilized or resolved: Yes. Denies suicidal/homicidal ideation: Yes. Issues/concerns per patient self-inventory: Yes. Other: None  New problem(s) identified: No, Describe:  None  New Short Term/Long Term Goal(s):medication stabilization, elimination of SI thoughts, development of comprehensive mental wellness plan.  Patient Goals:  Did not attend   Discharge Plan or Barriers: Patient will return home and will follow up with a local mental health agency for therapy and medication management.  Reason for Continuation of  Hospitalization: Medication stabilization  Estimated Length of Stay: Adequate for discharge   Attendees: Patient: Did not attend  01/16/2021   Physician: Clifton Custard, MD 01/16/2021   Nursing:  01/16/2021   RN  Care Manager: 01/16/2021   Social Worker: Melba Coon, LCSWA 01/16/2021   Recreational Therapist:  01/16/2021   Other:  01/16/2021   Other:  01/16/2021   Other: 01/16/2021      Scribe for Treatment Team: Aram Beecham, LCSWA 01/16/2021 11:31 AM

## 2021-01-16 NOTE — BHH Suicide Risk Assessment (Addendum)
[facility] Discharge Suicide Risk Assessment   Principal Problem: Schizophrenia, paranoid [facility]) Discharge Diagnoses: Principal Problem:   Schizophrenia, paranoid (HCC) Active Problems:   Paranoid schizophrenia (HCC)  Patient is a 30 year old male transferred from [facility] for stabilization and treatment of his schizophrenia, patient was noted to be hallucinating, not taking his psychotropic medication.  Patient reports that he is doing fairly well, adds that he plans to keep outpatient appointments, plans to go back to [place] as he is more support there.  Patient does acknowledge he is HIV positive, discussed the need for patient to take his medications for his HIV, patient states that he plans to do so.  Patient denies any thoughts of self-harm, harm to others, any psychotic symptoms.  Patient also denies any side effects with the medications Total Time spent with patient: 30 minutes  Musculoskeletal: Strength & Muscle Tone: within normal limits Gait & Station: normal Patient leans: N/A  Psychiatric Specialty Exam  Presentation  General Appearance: Fairly Groomed  Eye Contact:Fair  Speech:Normal Rate  Speech Volume:Normal  Handedness:Right   Mood and Affect  Mood:Anxious  Duration of Depression Symptoms: No data recorded Affect:Congruent   Thought Process  Thought Processes:Goal Directed  Descriptions of Associations:Circumstantial  Orientation:Full (Time, Place and Person)  Thought Content:Delusions  History of Schizophrenia/Schizoaffective disorder:Yes  Duration of Psychotic Symptoms:Greater than six months  Hallucinations:No data recorded Ideas of Reference:Delusions; Paranoia  Suicidal Thoughts:No data recorded Homicidal Thoughts:No data recorded  Sensorium  Memory:Immediate Fair; Recent Fair  Judgment:Impaired  Insight:Lacking   Executive Functions  Concentration:Good  Attention  Span:Good  Recall:Poor  Fund of Knowledge:Fair  Language:Good   Psychomotor Activity  Psychomotor Activity:No data recorded  Assets  Assets:Desire for Improvement; Resilience   Sleep  Sleep:No data recorded  Physical Exam: Physical Exam Review of Systems  Constitutional: Negative.  Negative for fever and malaise/fatigue.  HENT: Negative.  Negative for sore throat.   Eyes: Negative.  Negative for blurred vision, double vision and redness.  Respiratory: Negative.  Negative for cough and wheezing.   Cardiovascular: Negative.  Negative for palpitations.  Gastrointestinal: Negative.  Negative for abdominal pain, diarrhea, heartburn, nausea and vomiting.  Musculoskeletal: Negative.  Negative for myalgias.  Neurological: Negative.  Negative for dizziness, seizures and loss of consciousness.  Psychiatric/Behavioral: Negative.  Negative for depression, hallucinations, memory loss, substance abuse and suicidal ideas. The patient is not nervous/anxious and does not have insomnia.    Blood pressure 118/83, pulse 92, temperature 98.4 F (36.9 C), temperature source Oral, resp. rate 16, height 6' (1.829 m), weight 68 kg, SpO2 99 %. Body mass index is 20.34 kg/m.  Mental Status Per Nursing Assessment::   On Admission:  NA  Demographic Factors:  Male and Unemployed  Loss Factors: Financial problems/change in socioeconomic status  Historical Factors: NA  Risk Reduction Factors:   Positive social support  Continued Clinical Symptoms:  Schizophrenia:   Less than 73 years old  Cognitive Features That Contribute To Risk:  None    Suicide Risk:  Minimal: No identifiable suicidal ideation.  Patients presenting with no risk factors but with morbid ruminations; may be classified as minimal risk based on the severity of the depressive symptoms   Follow-up Information    [facility]. Go to.   Why: If you decide you would like to continue services for  therapy and medication management please walk-in Monday through Wednesday between 7:45am and 11am.  Contact information: [address] [place] [address]  Telephone:  [phone] Fax: [phone]              Plan Of Care/Follow-up recommendations:  Activity:  As tolerated Diet:  Heart healthy diet Other:  Keep follow-up appointment and take medications as prescribed  Nelly Rout, MD 01/16/2021, 12:29 PM

## 2021-01-16 NOTE — Progress Notes (Signed)
Recreation Therapy Notes  Date: 6.8.22 Time: 1000 Location: 500 Hall Dayroom  Group Topic: Communication  Goal Area(s) Addresses:  Patient will effectively communicate with peers in group.  Patient will verbalize benefit of healthy communication. Patient will verbalize positive effect of healthy communication on post d/c goals.  Patient will identify communication techniques that made activity effective for group.   Intervention: Drawings, Blank Paper, Pencils  Activity: Geometrical Drawings.  Three volunteers were given a picture to describe to rest of the group.  The presenters were to be as detailed as possible in describing the pictures to the group. If any person didn't understand something or needed to hear the instruction again, they could only ask the presenter to repeat themselves.  Pt were not to ask any detailed questions of the presenters.  When a each round was complete, the presenter would show their picture to the group to see how close to the original picture.  Education: Communication, Discharge Planning  Education Outcome: Acknowledges understanding/In group clarification offered/Needs additional education.   Clinical Observations/Feedback:  Pt did not attend group session.     Caroll Rancher, LRT/CTRS         Caroll Rancher A 01/16/2021 12:33 PM

## 2021-01-16 NOTE — Progress Notes (Signed)
  [facility] Adult Case Management Discharge Plan :  Will you be returning to the same living situation after discharge:  No. Will be discharged with shelter resources At discharge, do you have transportation home?: No. Bus passes will be provided Do you have the ability to pay for your medications: No. Samples to be provided at discharge  Release of information consent forms completed and in the chart;  Patient's signature needed at discharge.  Patient to Follow up at:  Follow-up Information    [facility]. Go to.   Why: If you decide you would like to continue services for therapy and medication management please walk-in Monday through Wednesday between 7:45am and 11am.  Contact information: [address] [place] [address]  Telephone: [phone] Fax: [phone]              Next level of care provider has access to [facility] Link:no  Safety Planning and Suicide Prevention discussed: Yes,  with mother  Have you used any form of tobacco in the last 30 days? (Cigarettes, Smokeless Tobacco, Cigars, and/or Pipes): Yes  Has patient been referred to the Quitline?: Patient refused referral  Patient has been referred for addiction treatment: Pt. refused referral  Otelia Santee, LCSW 01/16/2021, 11:13 AM

## 2021-01-16 NOTE — Progress Notes (Signed)
   01/16/21 0500  Sleep  Number of Hours 5.5

## 2021-01-16 NOTE — Plan of Care (Signed)
Pt did not attend any recreation therapy group sessions.   Caroll Rancher, LRT/CTRS

## 2021-02-03 ENCOUNTER — Encounter (HOSPITAL_COMMUNITY): Payer: Self-pay | Admitting: *Deleted

## 2021-02-03 ENCOUNTER — Other Ambulatory Visit: Payer: Self-pay

## 2021-02-03 ENCOUNTER — Ambulatory Visit (HOSPITAL_COMMUNITY)
Admission: EM | Admit: 2021-02-03 | Discharge: 2021-02-03 | Disposition: A | Discharge status: Home / Self Care | Payer: No Payment, Other | Source: Home / Self Care

## 2021-02-03 ENCOUNTER — Ambulatory Visit (HOSPITAL_COMMUNITY)
Admission: EM | Admit: 2021-02-03 | Discharge: 2021-02-03 | Disposition: A | Discharge status: Home / Self Care | Payer: No Payment, Other | Attending: Urology | Admitting: Urology

## 2021-02-03 ENCOUNTER — Emergency Department (HOSPITAL_COMMUNITY)
Admission: EM | Admit: 2021-02-03 | Discharge: 2021-02-03 | Disposition: A | Discharge status: Other Acute Inpatient Hospital | Payer: Self-pay | Attending: Emergency Medicine | Admitting: Emergency Medicine

## 2021-02-03 DIAGNOSIS — F17210 Nicotine dependence, cigarettes, uncomplicated: Secondary | ICD-10-CM | POA: Insufficient documentation

## 2021-02-03 DIAGNOSIS — Z760 Encounter for issue of repeat prescription: Secondary | ICD-10-CM | POA: Insufficient documentation

## 2021-02-03 DIAGNOSIS — F332 Major depressive disorder, recurrent severe without psychotic features: Secondary | ICD-10-CM | POA: Insufficient documentation

## 2021-02-03 DIAGNOSIS — R45851 Suicidal ideations: Secondary | ICD-10-CM

## 2021-02-03 DIAGNOSIS — Z5900 Homelessness unspecified: Secondary | ICD-10-CM | POA: Insufficient documentation

## 2021-02-03 DIAGNOSIS — F209 Schizophrenia, unspecified: Secondary | ICD-10-CM | POA: Insufficient documentation

## 2021-02-03 DIAGNOSIS — F419 Anxiety disorder, unspecified: Secondary | ICD-10-CM | POA: Insufficient documentation

## 2021-02-03 DIAGNOSIS — Z20822 Contact with and (suspected) exposure to covid-19: Secondary | ICD-10-CM | POA: Insufficient documentation

## 2021-02-03 DIAGNOSIS — F200 Paranoid schizophrenia: Secondary | ICD-10-CM | POA: Insufficient documentation

## 2021-02-03 DIAGNOSIS — Z79899 Other long term (current) drug therapy: Secondary | ICD-10-CM | POA: Insufficient documentation

## 2021-02-03 DIAGNOSIS — Z91128 Patient's intentional underdosing of medication regimen for other reason: Secondary | ICD-10-CM | POA: Insufficient documentation

## 2021-02-03 LAB — RAPID URINE DRUG SCREEN, HOSP PERFORMED
Amphetamines: NOT DETECTED
Barbiturates: NOT DETECTED
Benzodiazepines: NOT DETECTED
Cocaine: NOT DETECTED
Opiates: NOT DETECTED
Tetrahydrocannabinol: POSITIVE — AB

## 2021-02-03 LAB — ACETAMINOPHEN LEVEL: Acetaminophen (Tylenol), Serum: <10 ug/mL — ABNORMAL LOW (ref 10–30)

## 2021-02-03 LAB — COMPREHENSIVE METABOLIC PANEL
ALT: 17 U/L (ref 0–44)
AST: 19 U/L (ref 15–41)
Albumin: 4.1 g/dL (ref 3.5–5.0)
Alkaline Phosphatase: 59 U/L (ref 38–126)
Anion gap: 8 (ref 5–15)
BUN: 11 mg/dL (ref 6–20)
CO2: 28 mmol/L (ref 22–32)
Calcium: 9.8 mg/dL (ref 8.9–10.3)
Chloride: 104 mmol/L (ref 98–111)
Creatinine, Ser: 1.13 mg/dL (ref 0.61–1.24)
GFR, Estimated: >60 mL/min (ref >60)
Glucose, Bld: 111 mg/dL — ABNORMAL HIGH (ref 70–99)
Potassium: 3.7 mmol/L (ref 3.5–5.1)
Sodium: 140 mmol/L (ref 135–145)
Total Bilirubin: 2.2 mg/dL — ABNORMAL HIGH (ref 0.3–1.2)
Total Protein: 7.2 g/dL (ref 6.5–8.1)

## 2021-02-03 LAB — CBC
HCT: 45.6 % (ref 39.0–52.0)
Hemoglobin: 15 g/dL (ref 13.0–17.0)
MCH: 31.8 pg (ref 26.0–34.0)
MCHC: 32.9 g/dL (ref 30.0–36.0)
MCV: 96.8 fL (ref 80.0–100.0)
Platelets: 243 10*3/uL (ref 150–400)
RBC: 4.71 MIL/uL (ref 4.22–5.81)
RDW: 12.2 % (ref 11.5–15.5)
WBC: 5.6 10*3/uL (ref 4.0–10.5)
nRBC: 0 % (ref 0.0–0.2)

## 2021-02-03 LAB — ETHANOL: Alcohol, Ethyl (B): <10 mg/dL (ref <10)

## 2021-02-03 LAB — RESP PANEL BY RT-PCR (FLU A&B, COVID) ARPGX2
Influenza A by PCR: NEGATIVE
Influenza B by PCR: NEGATIVE
SARS Coronavirus 2 by RT PCR: NEGATIVE

## 2021-02-03 LAB — SALICYLATE LEVEL: Salicylate Lvl: <7 mg/dL — ABNORMAL LOW (ref 7.0–30.0)

## 2021-02-03 MED ORDER — TRAZODONE HCL 50 MG PO TABS: 50.0000 mg | ORAL_TABLET | Freq: Every day | ORAL | 0 refills | Status: DC

## 2021-02-03 MED ORDER — OLANZAPINE 15 MG PO TBDP: 15.0000 mg | ORAL_TABLET | Freq: Every day | ORAL | 0 refills | Status: DC

## 2021-02-03 MED ORDER — BICTEGRAVIR-EMTRICITAB-TENOFOV 50-200-25 MG PO TABS: 1.0000 | ORAL_TABLET | Freq: Every day | ORAL | 0 refills | Status: AC

## 2021-02-03 NOTE — ED Notes (Signed)
Security wanding patient and belongings. Belongings secured and ED Tech going through valuables.  Pt has valuables locked up with security.

## 2021-02-03 NOTE — Progress Notes (Signed)
   02/03/21 0122  [facility] Triage Screening (Walk-ins at [facility] only)  How Did You Hear About [place]? Self  What Is the Reason for Your Visit/Call Today? Pt reports he ran out of his psychiatric medications.  How Long Has This Been Causing You Problems? <Week  Have You Recently Had Any Thoughts About Hurting Yourself? No  Are You Planning to Commit Suicide/Harm Yourself At This time? No  Have you Recently Had Thoughts About Hurting Someone Karolee Ohs? No  Are You Planning To Harm Someone At This Time? No  Are you currently experiencing any auditory, visual or other hallucinations? No  Have You Used Any Alcohol or Drugs in the Past 24 Hours? No  Do you have any current medical co-morbidities that require immediate attention? No  Clinician description of patient physical appearance/behavior: Pt is casually dressed, calm, and cooperative.  What Do You Feel Would Help You the Most Today? Medication(s)  If access to [facility] Urgent Care was not available, would you have sought care in the Emergency Department? Yes  Determination of Need Routine (7 days)  Options For Referral Medication Management   Pt is a 30 year old single male who presents unaccompanied to [facility] voluntarily via [facility]. Pt has a diagnosis of schizophrenia and was inpatient at [facility] 06/02-06/03/2021 (see medical record for clinical details). Pt reports he ran out of medication three days ago and needs more medication. He says he has been more paranoid and has difficulty sleeping. He reports sleeping 3 hours, fatigue, and irritability. He denies other depressive symptoms. He denies anxiety. He denies current suicidal ideation. He denies homicidal ideation. He denies current auditory or visual hallucinations. He denies alcohol or other substance use.  Pt reports he is homeless. He is requesting food and drink. He says he only wants medication and feels safe to leave [facility].  Gave clinical report to Cecilio Asper, NP who said she will complete MSE  and discuss medications with Pt.

## 2021-02-03 NOTE — ED Provider Notes (Signed)
[facility]-EMERGENCY DEPT [facility] Emergency Department Provider Note MRN:  [id number]  Arrival date & time: 02/03/21     Chief Complaint   Suicidal and Rib Injury   History of Present Illness   Benjamin Simpson is a 30 y.o. year-old male with a history of schizophrenia presenting to the ED with chief complaint of SI.  Patient originally came to the emergency department for soreness to the left flank.  Larey Seat off of the long bed of a truck 2 weeks ago and has some continued soreness.  He is able to ambulate, has no numbness or weakness to the arms or legs, no bowel or bladder dysfunction, did not hit head, no LOC.  Pain is mild, worse with certain positions or motions.  While in the waiting room, he began contemplating his life and became more and more suicidal.  Has no support, struggling with homelessness recently, decided to take his life by hanging himself.  Rather than doing so, decided to discuss this with medical personnel today.  Denies any hallucinations, no drug use today.  Review of Systems  A complete 10 system review of systems was obtained and all systems are negative except as noted in the HPI and PMH.   Patient's Health History   History reviewed. No pertinent past medical history.  History reviewed. No pertinent surgical history.  No family history on file.  Social History   Socioeconomic History   Marital status: Single    Spouse name: Not on file   Number of children: Not on file   Years of education: Not on file   Highest education level: Not on file  Occupational History   Not on file  Tobacco Use   Smoking status: Every Day    Packs/day: 1.00    Years: 10.00    Pack years: 10.00    Types: Cigarettes   Smokeless tobacco: Never   Tobacco comments:    Patient refused  Vaping Use   Vaping Use: Never used  Substance and Sexual Activity   Alcohol use: Not Currently    Comment: twice a week   Drug use: Not Currently    Types: Marijuana   Sexual  activity: Never  Other Topics Concern   Not on file  Social History Narrative   ** Merged History Encounter **       Social Determinants of Health   Financial Resource Strain: Not on file  Food Insecurity: Not on file  Transportation Needs: Not on file  Physical Activity: Not on file  Stress: Not on file  Social Connections: Not on file  Intimate Partner Violence: Not on file     Physical Exam   Vitals:   02/03/21 0428  BP: 118/71  Pulse: 71  Resp: 18  Temp: 97.8 F (36.6 C)  SpO2: 98%    CONSTITUTIONAL: Well-appearing, NAD NEURO:  Alert and oriented x 3, no focal deficits EYES:  eyes equal and reactive ENT/NECK:  no LAD, no JVD CARDIO: Regular rate, well-perfused, normal S1 and S2 PULM:  CTAB no wheezing or rhonchi GI/GU:  normal bowel sounds, non-distended, non-tender MSK/SPINE:  No gross deformities, no edema SKIN:  no rash, atraumatic PSYCH:  Appropriate speech and behavior, seems depressed  *Additional and/or pertinent findings included in MDM below  Diagnostic and Interventional Summary    EKG Interpretation  Date/Time:    Ventricular Rate:    PR Interval:    QRS Duration:   QT Interval:    QTC Calculation:   R  Axis:     Text Interpretation:          Labs Reviewed  COMPREHENSIVE METABOLIC PANEL - Abnormal; Notable for the following components:      Result Value   Glucose, Bld 111 (*)    Total Bilirubin 2.2 (*)    All other components within normal limits  SALICYLATE LEVEL - Abnormal; Notable for the following components:   Salicylate Lvl <7.0 (*)    All other components within normal limits  ACETAMINOPHEN LEVEL - Abnormal; Notable for the following components:   Acetaminophen (Tylenol), Serum <10 (*)    All other components within normal limits  RAPID URINE DRUG SCREEN, HOSP PERFORMED - Abnormal; Notable for the following components:   Tetrahydrocannabinol POSITIVE (*)    All other components within normal limits  ETHANOL  CBC    No  orders to display    Medications - No data to display   Procedures  /  Critical Care Procedures  ED Course and Medical Decision Making  I have reviewed the triage vital signs, the nursing notes, and pertinent available records from the EMR.  Listed above are laboratory and imaging tests that I personally ordered, reviewed, and interpreted and then considered in my medical decision making (see below for details).  Recent behavioral health admission earlier in the month, evaluated by [facility] at 2 AM this morning and contracted for safety.  Equation seems to have changed now that he has SI with a specific plan.  Though I am and considering he may be motivated by secondary gain, will consult TTS for evaluation.  Patient is medically cleared.  His exam is nontraumatic, bilateral breath sounds, no acute distress, normal vital signs, moves all extremities without issue, trauma was 2 weeks ago, pain consistent with muscular bruising or soreness.  No indication for imaging.     Plan is to transfer to [facility] for further care.  Elmer Sow. Pilar Plate, MD [facility] Emergency Medicine [facility] [email or web address]  Final Clinical Impressions(s) / ED Diagnoses     ICD-10-CM   1. Suicidal ideation  R45.851       ED Discharge Orders     None        Discharge Instructions Discussed with and Provided to Patient:   Discharge Instructions   None       Sabas Sous, MD 02/03/21 1047

## 2021-02-03 NOTE — ED Notes (Signed)
Pt eating breakfast.  He got up and shut door to room.  Explained to him that door has to stay open.  TTS machine at bedside and [facility] notified that it is ready.

## 2021-02-03 NOTE — ED Notes (Signed)
Safe transport called 

## 2021-02-03 NOTE — Discharge Instructions (Addendum)
Take all medications as prescribed. Keep all follow-up appointments as scheduled.  Do not consume alcohol or use illegal drugs while on prescription medications. Report any adverse effects from your medications to your primary care provider promptly.  In the event of recurrent symptoms or worsening symptoms, call 911, a crisis hotline, or go to the nearest emergency department for evaluation.   

## 2021-02-03 NOTE — ED Provider Notes (Addendum)
Behavioral Health Urgent Care Medical Screening Exam  Patient Name: Benjamin Simpson MRN: [id number] Date of Evaluation: 02/03/21 Chief Complaint:   Diagnosis:  Final diagnoses:  Suicidal ideation  Homeless    History of Present illness: Benjamin Simpson is a 30 y.o. male Patient presents to the [facility] voluntarily from [facility] via safe transport with a chief complaint of "I need xanax for my anxiety." Patient has a hx of Schizophrenia.  Patient reports that he came from [facility] for suicide watch. He states that he was supposed to go to [facility] but they do what they want to do. He states that he has anxiety and schizophrenia and he told them that he needs a refill for his medications and suicide watch, but they sent him here instead of [facility].   He states that last night he had suicidal thoughts after his car broke down. He states that he is homeless and that he depends on this car to get around. He states that he is here by himself from [place]. He states that he came here from [place] for vacation. He states that he plans on staying in [place]. He states that he does not have any family here in [place] and that his family is in [place] and [place].    When asked if he had gotten his prescriptions filled from this morning when he initially presented to the [facility] for his anxiety and schizophrenia, he states that he went to the pharmacy but he did not have the right medications. When asked what was wrong with his medications, he states that he was not given a prescription for Xanax. He states that he needs a prescription for Xanax for his anxiety. He states that he has been taking Xanax for his anxiety since 2014. When asked if he is having suicidal thoughts, he states "I do not have suicidal thoughts, but I feel like cutting."  When asked if he's been cutting his skin he states that he cut last week.  When asked  what part of his body that did he cut, he states "under my thigh but you would not be able to see it."  When asked if he is hearing voices or seeing things that other people cannot hear or see, he states "no no I am good."  On approach, the patient is alert and oriented x 4. Patient thought process is logical, speech coherent and tone is normal. Mood is anxious. Fair eye contact. Patient is causally dressed. Patient denies suicidal thoughts. Patient denies homicidal ideations. Patient denies AVH. Patient does not appear to be responding to internal, or external stimuli.   This provider explained to the patient that he would not receive a prescription for Xanax at this facility or on an inpatient unit. He was advised that he will need to follow up with outpatient services to establish treatment and to discuss obtaining a prescription for Xanax. He was provided with outpatient resources for the [facility] Outpatient to establish treatment for medication management.  Per chart review: Patient was seen this morning at the [facility] and was provided with 30 day prescriptions for Zyprexa Biktarvy and Trazodone. Patient then presented to [facility] Emergency Department for medical issues and reporting suicidal ideations.  Psychiatric Specialty Exam  Presentation  General Appearance:Appropriate for Environment  Eye Contact:Fair  Speech:Clear and Coherent  Speech Volume:Normal  Handedness:Right   Mood and Affect  Mood:Anxious  Affect:Congruent  Thought Process  Thought Processes:Coherent; Goal Directed  Descriptions of Associations:Intact  Orientation:Full (Time, Place and Person)  Thought Content:Logical  Diagnosis of Schizophrenia or Schizoaffective disorder in past: Yes (Per history, Pt has Schizophrenia)  Duration of Psychotic Symptoms: Greater than six months  Hallucinations:None NA  Ideas of Reference:None  Suicidal Thoughts:No  Homicidal  Thoughts:No   Sensorium  Memory:Immediate Fair; Recent Fair; Remote Fair  Judgment:Fair  Insight:Fair   Executive Functions  Concentration:Fair  Attention Span:Fair  Recall:Fair  Fund of Knowledge:Fair  Language:Fair   Psychomotor Activity  Psychomotor Activity:Normal   Assets  Assets:Communication Skills; Desire for Improvement; Leisure Time; Physical Health   Sleep  Sleep:Fair  Number of hours: 8   No data recorded  Physical Exam: Physical Exam HENT:     Head: Normocephalic.  Eyes:     Conjunctiva/sclera: Conjunctivae normal.  Cardiovascular:     Rate and Rhythm: Normal rate.  Pulmonary:     Effort: Pulmonary effort is normal.  Musculoskeletal:        General: Normal range of motion.     Cervical back: Normal range of motion.  Neurological:     General: No focal deficit present.     Mental Status: He is alert.  Psychiatric:        Attention and Perception: Attention normal.        Mood and Affect: Mood is anxious.        Speech: Speech normal.        Cognition and Memory: Cognition normal.   Review of Systems  Constitutional: Negative.   HENT: Negative.    Eyes: Negative.   Respiratory: Negative.    Cardiovascular: Negative.   Gastrointestinal: Negative.   Genitourinary: Negative.   Musculoskeletal: Negative.   Skin: Negative.   Neurological: Negative.   Endo/Heme/Allergies: Negative.   Psychiatric/Behavioral:  Negative for hallucinations and suicidal ideas. The patient is nervous/anxious.   Blood pressure 123/74, pulse 75, temperature 98.5 F (36.9 C), temperature source Oral, resp. rate 16, SpO2 99 %. There is no height or weight on file to calculate BMI.  Musculoskeletal: Strength & Muscle Tone: within normal limits Gait & Station: normal Patient leans: N/A   [facility] MSE Discharge Disposition for Follow up and Recommendations: Based on my evaluation the patient does not appear to have an emergency medical condition and can be  discharged with resources and follow up care in outpatient services for Medication Management, Individual Therapy, and Group Therapy  Based on my evaluation the patient does not meet criteria for inpatient treatment and will benefit from outpatient services. Patient has been provided with prescriptions for current psychotropics and provided with resources for medication management.    Layla Barter, NP 02/03/2021, 4:02 PM

## 2021-02-03 NOTE — BH Assessment (Signed)
Comprehensive Clinical Assessment (CCA) Note  02/03/2021 [facility] [id number]  Disposition:  Gave clinical report to T. Lewis, NP, who determined that Pt is appropriate for transfer to [facility] for stabilization, observation, and to restart medication.  [facility] staff advised.  The patient demonstrates the following risk factors for suicide: Chronic risk factors for suicide include: psychiatric disorder of Schizophrenia (per history, substance use disorder, previous suicide attempts most recent attempt in 2019, and demographic factors (male, 34>60 y/o). Acute risk factors for suicide include: family or marital conflict. Protective factors for this patient include: responsibility to others (children, family). Considering these factors, the overall suicide risk at this point appears to be moderate. Patient is not appropriate for outpatient follow up.   Flowsheet Row [facility] Most recent reading at 02/03/2021  9:48 AM Ad[facility] (Discharged) [facility] Most recent reading at 01/10/2021  8:35 PM [facility] Most recent reading at 01/10/2021  5:46 PM  C-SSRS RISK CATEGORY Moderate Risk No Risk Error: Q3, 4, or 5 should not be populated when Q2 is No      Moderate risk of suicidal behavior is indicated.  2:1 sitter protocol is recommended.    Chief Complaint:  Chief Complaint  Patient presents with   Suicidal    Pt endorsed recent suicidal ideation with plan due to social stressors.   Rib Injury   Visit Diagnosis: Major Depressive Disorder, Recurrent, Severe w/o psychotic features; Schizophrenia per history   Narrative:  Pt is a 30 year old male who [facility] on a [facility] basis with complaint of physical pain due to accident.  While in the waiting room, Pt endorsed suicidal ideation with plan to hang himself.  Pt stated that  he is unemployed and does not have an outpatient psychiatrist/therapist in [place], having [place].  Pt stated that while he was in [place], he was treated for Schizophrenia with Zyprexa and treated for anxiety with Xanax.  Pt was last assessed by TTS on January 09, 2021.  At that time, Pt presented to the ED under IVC (petitioner is Pt's mother) due to non-compliance with medication, hallucination, and aggression.  Per history, Pt is HIV+.  Pt presented to the ED today with compliant of lateral pain due to fall out of a truck.  While waiting in the ED, Pt endorsed suicidal ideation with a plan to hang himself.  He reported that he has felt suicidal ideation for about 24 hours, although he said he has felt less suicidal as the morning progresses.  Pt endorsed the following symptoms:  Suicidal ideation with plan; despondency; insomnia; low energy; hopelessness.  Pt's UDS was positive for THC, but Pt denied recent use, stating that he only used recreationally while living in [place].   Pt indicated that he feels depressed because he was kicked out of his family's home on Saturday.  Pt endorsed past suicide attempts, with last attempt being in 2019.  Pt denied a history of trauma.  Pt denied homicidal ideation, hallucination, and self-injurious concerns.  Per earlier assessment, Pt has a history of hallucinations.  He stated that he does not have a psychiatrist right now, and he would like to be prescribed Zyprexa and Xanax as prescribed in [place].  During assessment, Pt presented as alert and oriented.  He had good eye contact and was cooperative.  Pt was appropriately  groomed, and his demeanor was calm.  Pt's mood was reported as depressed.  Affect was full-range.  Pt's speech was normal in rate, rhythm, and volume.  Thought processes were within normal range, and thought content was logical and goal-oriented.  There was no evidence of delusion.  Insight, judgment, and impulse control were fair.     CCA Screening, Triage and Referral (STR)  Patient Reported Information How did you hear about [place]? Self  What Is the Reason for Your Visit/Call Today? Pt reports he ran out of his psychiatric medications.  How Long Has This Been Causing You Problems? <Week  What Do You Feel Would Help You the Most Today? Medication(s)   Have You Recently Had Any Thoughts About Hurting Yourself? Yes  Are You Planning to Commit Suicide/Harm Yourself At This time? No   Have you Recently Had Thoughts About Hurting Someone Karolee Ohs? No  Are You Planning to Harm Someone at This Time? No  Explanation: No data recorded  Have You Used Any Alcohol or Drugs in the Past 24 Hours? No (Pt denied; however, UDS +THC)  How Long Ago Did You Use Drugs or Alcohol? No data recorded What Did You Use and How Much? No data recorded  Do You Currently Have a Therapist/Psychiatrist? No  Name of Therapist/Psychiatrist: No data recorded  Have You Been Recently Discharged From Any Office Practice or Programs? No  Explanation of Discharge From Practice/Program: No data recorded    CCA Screening Triage Referral Assessment Type of Contact: Face-to-Face  Telemedicine Service Delivery:   Is this Initial or Reassessment? No data recorded Date Telepsych consult ordered in [facility]:  No data recorded Time Telepsych consult ordered in [facility]:  No data recorded Location of Assessment: [facility] ED  Provider Location: [facility] Assessment Services   Collateral Involvement: NA   Does Patient Have a Automotive engineer Guardian? No data recorded Name and Contact of Legal Guardian: No data recorded If Minor and Not Living with Parent(s), Who has Custody? No data recorded Is CPS involved or ever been involved? Never  Is APS involved or ever been involved? Never   Patient Determined To Be At Risk for Harm To Self or Others Based on Review of Patient Reported Information or Presenting Complaint? Yes, for Self-Harm  Method: No data  recorded Availability of Means: No data recorded Intent: No data recorded Notification Required: No data recorded Additional Information for Danger to Others Potential: No data recorded Additional Comments for Danger to Others Potential: No data recorded Are There Guns or Other Weapons in Your Home? No data recorded Types of Guns/Weapons: No data recorded Are These Weapons Safely Secured?                            No data recorded Who Could Verify You Are Able To Have These Secured: No data recorded Do You Have any Outstanding Charges, Pending Court Dates, Parole/Probation? No data recorded Contacted To Inform of Risk of Harm To Self or Others: No data recorded   Does Patient Present under Involuntary Commitment? No  IVC Papers Initial File Date: 01/09/21   [place] of Residence: [place] (pt is visiting [place]--his mother lives here.)   Patient Currently Receiving the Following Services: Not Receiving Services (Just moved from [place])   Determination of Need: Urgent (48 hours)   Options For Referral: Outpatient Therapy; Medication Management; BH Urgent Care     CCA Biopsychosocial Patient Reported Schizophrenia/Schizoaffective Diagnosis in Past:  Yes (Per history, Pt has Schizophrenia)   Strengths: pt cooperative throughout assessment   Mental Health Symptoms Depression:   Hopelessness; Sleep (too much or little); Irritability   Duration of Depressive symptoms:    Mania:   None   Anxiety:    Fatigue; Irritability; Restlessness   Psychosis:   None   Duration of Psychotic symptoms:  Duration of Psychotic Symptoms: Greater than six months   Trauma:   None   Obsessions:   None   Compulsions:   None   Inattention:   None   Hyperactivity/Impulsivity:   None   Oppositional/Defiant Behaviors:   None   Emotional Irregularity:   Recurrent suicidal behaviors/gestures/threats   Other Mood/Personality Symptoms:  No data recorded   Mental Status  Exam Appearance and self-care  Stature:   Average   Weight:   Average weight   Clothing:   Casual   Grooming:   Normal   Cosmetic use:   None   Posture/gait:   Normal   Motor activity:   Not Remarkable   Sensorium  Attention:   Normal   Concentration:   Normal   Orientation:   X5   Recall/memory:   Normal   Affect and Mood  Affect:   Appropriate   Mood:   Depressed   Relating  Eye contact:   Normal   Facial expression:   Responsive   Attitude toward examiner:   Cooperative   Thought and Language  Speech flow:  Clear and Coherent   Thought content:   Appropriate to Mood and Circumstances   Preoccupation:   None   Hallucinations:   None   Organization:  No data recorded  [employer] of Knowledge:   Average   Intelligence:   Average   Abstraction:   Functional   Judgement:   Fair   Reality Testing:   Adequate   Insight:   Fair   Decision Making:   Normal   Social Functioning  Social Maturity:   Isolates   Social Judgement:   Normal   Stress  Stressors:   Family conflict; Transitions   Coping Ability:   Deficient supports; Overwhelmed   Skill Deficits:   Self-care   Supports:   Family     Religion: Religion/Spirituality Are You A Religious Person?: Yes  Leisure/Recreation: Leisure / Recreation Do You Have Hobbies?: Yes Leisure and Hobbies: reading, puzzles, watching TV  Exercise/Diet: Exercise/Diet Do You Exercise?: Yes What Type of Exercise Do You Do?: Run/Walk How Many Times a Week Do You Exercise?: 6-7 times a week Have You Gained or Lost A Significant Amount of Weight in the Past Six Months?: Yes-Lost Do You Follow a Special Diet?: No Do You Have Any Trouble Sleeping?: Yes Explanation of Sleeping Difficulties: 4 hours per night   CCA Employment/Education Employment/Work Situation: Employment / Work Situation Employment Situation: Employed Patient's Job has Been Impacted  by Current Illness: No Has Patient ever Been in Equities trader?: No  Education: Education Is Patient Currently Attending School?: No Did Theme park manager?: Yes Did You Have An Individualized Education Program (IIEP): No Did You Have Any Difficulty At [employer]?: No Patient's Education Has Been Impacted by Current Illness: No   CCA Family/Childhood History Family and Relationship History: Family history Marital status: Single Does patient have children?: Yes How many children?: 2 How is patient's relationship with their children?: 3yo and 4yo daughters - nice relationship  Childhood History:  Childhood History By whom was/is the patient raised?: Mother,  Mother/father and step-parent Description of patient's current relationship with siblings: Good relationships Did patient suffer any verbal/emotional/physical/sexual abuse as a child?: No Did patient suffer from severe childhood neglect?: No Has patient ever been sexually abused/assaulted/raped as an adolescent or adult?: No Was the patient ever a victim of a crime or a disaster?: No Witnessed domestic violence?: No Has patient been affected by domestic violence as an adult?: No  Child/Adolescent Assessment:     CCA Substance Use Alcohol/Drug Use: Alcohol / Drug Use Pain Medications: Please see MAR Prescriptions: Please see MAR Over the Counter: Please see MAR History of alcohol / drug use?: Yes (Use of THC) Negative Consequences of Use: Work / Programmer, multimedia, Copywriter, advertising relationships                         ASAM's:  Six Dimensions of Multidimensional Assessment  Dimension 1:  Acute Intoxication and/or Withdrawal Potential:   Dimension 1:  Description of individual's past and current experiences of substance use and withdrawal: Pt denied current use; per report, Pt had rec use of THC  Dimension 2:  Biomedical Conditions and Complications:   Dimension 2:  Description of patient's biomedical conditions and  complications:  Pt indicated physical pain due to discomfort  Dimension 3:  Emotional, Behavioral, or Cognitive Conditions and Complications:  Dimension 3:  Description of emotional, behavioral, or cognitive conditions and complications: Per history, Pt has a history of Schizophrenia  Dimension 4:  Readiness to Change:  Dimension 4:  Description of Readiness to Change criteria: Pt denied current use  Dimension 5:  Relapse, Continued use, or Continued Problem Potential:     Dimension 6:  Recovery/Living Environment:  Dimension 6:  Recovery/Iiving environment criteria description: Pt reported that he is now homeless  ASAM Severity Score: ASAM's Severity Rating Score: 10  ASAM Recommended Level of Treatment: ASAM Recommended Level of Treatment: Level II Intensive Outpatient Treatment   Substance use Disorder (SUD) Substance Use Disorder (SUD)  Checklist Symptoms of Substance Use: Continued use despite having a persistent/recurrent physical/psychological problem caused/exacerbated by use  Recommendations for Services/Supports/Treatments: Recommendations for Services/Supports/Treatments Recommendations For Services/Supports/Treatments: SAIOP (Substance Abuse Intensive Outpatient Program)  Discharge Disposition:    DSM5 Diagnoses: Patient Active Problem List   Diagnosis Date Noted   Severe episode of recurrent major depressive disorder, without psychotic features (HCC)    Paranoid schizophrenia (HCC) 01/10/2021   Schizophrenia, paranoid (HCC) 01/09/2021     Referrals to Alternative Service(s): Referred to Alternative Service(s):   Place:   Date:   Time:    Referred to Alternative Service(s):   Place:   Date:   Time:    Referred to Alternative Service(s):   Place:   Date:   Time:    Referred to Alternative Service(s):   Place:   Date:   Time:     Earline [place], [facility]

## 2021-02-03 NOTE — Discharge Instructions (Addendum)

## 2021-02-03 NOTE — ED Provider Notes (Addendum)
Behavioral Health Urgent Care Medical Screening Exam  Patient Name: Benjamin Simpson MRN: [id number] Date of Evaluation: 02/03/21 Chief Complaint:   Diagnosis:  Final diagnoses:  Schizophrenia, paranoid (HCC)    History of Present illness: Benjamin Simpson is a 30 y.o. male.  Patient presented voluntarily to [facility] via Patent examiner. Patient reported that he ran out of his medication; he stated that he came to [facility]  to request prescription refill and food to eat. He report that he is diagnosed with schizophrenia and has not taken his medication for ~3 days.  Patient report that he would like prescription refill for Xanax, zyprexa, trazodone, and Biktarvy.   Patient verbally contracted for safety. He denies self-harming, SI, HI, AVH, and no delusional thought content noted on assessment. He endorsed chronic paranoia, he report paranoia is slightly worse than usual due to not taking medition for 3 days. He denies alcohol and illicit drug abuse. He report difficulty sleeping due to running out of sleeping medication and homelessness.   Per chat review, patient was [facility] from [facility] on [facility]. At time of discharge, patient was given 30 days prescriptions for Zyprexa 15mg /day, Biktarvy 50-200-25 mg/day, and Trazodone 50mg /QHS. It appeared that patient did not pick up these medications from the pharmacy based on available dispensary report although patient reported that he picked up medication and has been complaint with medication til about 3 days ago. When questioned further patient stated "I picked my medicine from a small [facility] and they don't use computer that's why its not showing up in the computer and I also paid for it with my friend's debit card. It's not gonna show up in my record because I told them not to put it in my record." This writer also reviewed PDMP and there was no record that patient is currently on Xanax. This informed patient that he should follow with outpatient  provider with request for Xanax.    Patient was assessed by this NP. Patient is alert and oriented X4, he is calm and cooperative, his mood is euthymic, affect is congruent with his mood, his speech is clear and coherent. He maintained good eye contact, he didn't appear to be responding to any internal/external stimuli. He was able to fully participate in assessment. He denied suicidal ideation/intent/plan. He denied medical complaint including no chest pain, SOB, fever, acute distress, pain, abd or urinary issues.  Psychiatric Specialty Exam  Presentation  General Appearance:Fairly Groomed  Eye Contact:Good  Speech:Clear and Coherent  Speech Volume:Normal  Handedness:Right   Mood and Affect  Mood:Euthymic  Affect:Congruent   Thought Process  Thought Processes:Coherent; Goal Directed  Descriptions of Associations:Intact  Orientation:Full (Time, Place and Person)  Thought Content:Logical; WDL  Diagnosis of Schizophrenia or Schizoaffective disorder in past: Yes  Duration of Psychotic Symptoms: Greater than six months  Hallucinations:None NA  Ideas of Reference:None  Suicidal Thoughts:No  Homicidal Thoughts:No   Sensorium  Memory:Immediate Good; Recent Good; Remote Good  Judgment:Good  Insight:Good   Executive Functions  Concentration:Good  Attention Span:Good  Recall:Good  Fund of Knowledge:Good  Language:Good   Psychomotor Activity  Psychomotor Activity:Normal   Assets  Assets:Communication Skills; Physical Health; Financial Resources/Insurance   Sleep  Sleep:Good  Number of hours: 8   No data recorded  Physical Exam: Physical Exam Vitals and nursing note reviewed.  Constitutional:      General: He is not in acute distress.    Appearance: He is well-developed. He is not ill-appearing, toxic-appearing or diaphoretic.  HENT:  Head: Normocephalic and atraumatic.     Nose: Nose normal.     Mouth/Throat:     Mouth: Mucous  membranes are moist.  Eyes:     Conjunctiva/sclera: Conjunctivae normal.  Cardiovascular:     Rate and Rhythm: Normal rate.  Pulmonary:     Effort: Pulmonary effort is normal. No respiratory distress.     Breath sounds: Normal breath sounds. No wheezing.  Chest:     Chest wall: No tenderness.  Abdominal:     General: There is no distension.     Palpations: Abdomen is soft.     Tenderness: There is no abdominal tenderness.  Musculoskeletal:     Cervical back: Neck supple.  Skin:    General: Skin is warm and dry.  Neurological:     Mental Status: He is alert and oriented to person, place, and time.  Psychiatric:        Attention and Perception: Attention and perception normal. He does not perceive auditory or visual hallucinations.        Mood and Affect: Mood normal. Mood is not anxious or depressed.        Speech: Speech normal.        Behavior: Behavior normal. Behavior is cooperative.        Thought Content: Thought content normal. Thought content is not paranoid or delusional. Thought content does not include homicidal or suicidal ideation. Thought content does not include homicidal or suicidal plan.        Cognition and Memory: Cognition normal.   Review of Systems  Constitutional: Negative.   HENT: Negative.    Eyes: Negative.   Respiratory: Negative.    Cardiovascular: Negative.   Gastrointestinal: Negative.   Genitourinary: Negative.   Musculoskeletal: Negative.   Skin: Negative.   Blood pressure 91/63, pulse 84, temperature 98.4 F (36.9 C), temperature source Oral, resp. rate 16, SpO2 98 %. There is no height or weight on file to calculate BMI.  Musculoskeletal: Strength & Muscle Tone: within normal limits Gait & Station: normal Patient leans: Right   [facility] MSE Discharge Disposition for Follow up and Recommendations: Based on my evaluation the patient does not appear to have an emergency medical condition and can be discharged with resources and follow up care  in outpatient services for Medication Management and Individual Therapy  Patient contracted for safety and reported he will be able to maintain safety upon discharge. Patient was given another 1 month prescription for Zyprexa 15mg /day, Biktarvy 50-200-25 mg/day, and Trazodone 50mg /QHS.   , NP 02/03/2021, 2:01 AM

## 2021-02-03 NOTE — ED Triage Notes (Signed)
Pt states he fell out of a pick up truck onto the ground and hurt his left rib area.  Reports pain with inspiration. Pt states he is suicidal and has plans to hang himself

## 2021-02-03 NOTE — ED Notes (Signed)
Pt lying in bed.  States he didn't rest well during the night and is having a hard time with depression.  Reports suicidal plan to hang himself.  Reports L rib pain since falling when getting out of truck last night.  EDP in to assess pt prior to RN.  Breakfast tray at bedside.  Pt states he will eat when he wakes up more.  Denies any needs at this time.  Pt in view of nursing station.

## 2022-09-12 ENCOUNTER — Other Ambulatory Visit: Payer: Self-pay

## 2022-09-12 ENCOUNTER — Emergency Department (HOSPITAL_COMMUNITY)
Admission: EM | Admit: 2022-09-12 | Discharge: 2022-09-12 | Disposition: A | Discharge status: Home / Self Care | Payer: Medicaid Other | Attending: Emergency Medicine | Admitting: Emergency Medicine

## 2022-09-12 ENCOUNTER — Emergency Department (HOSPITAL_COMMUNITY): Payer: Medicaid Other

## 2022-09-12 ENCOUNTER — Encounter (HOSPITAL_COMMUNITY): Payer: Self-pay

## 2022-09-12 DIAGNOSIS — M1711 Unilateral primary osteoarthritis, right knee: Secondary | ICD-10-CM | POA: Insufficient documentation

## 2022-09-12 DIAGNOSIS — R059 Cough, unspecified: Secondary | ICD-10-CM | POA: Diagnosis present

## 2022-09-12 DIAGNOSIS — J40 Bronchitis, not specified as acute or chronic: Secondary | ICD-10-CM | POA: Diagnosis not present

## 2022-09-12 DIAGNOSIS — Z21 Asymptomatic human immunodeficiency virus [HIV] infection status: Secondary | ICD-10-CM | POA: Diagnosis not present

## 2022-09-12 DIAGNOSIS — Z20822 Contact with and (suspected) exposure to covid-19: Secondary | ICD-10-CM | POA: Diagnosis not present

## 2022-09-12 LAB — RESP PANEL BY RT-PCR (RSV, FLU A&B, COVID)  RVPGX2
Influenza A by PCR: NEGATIVE
Influenza B by PCR: NEGATIVE
Resp Syncytial Virus by PCR: NEGATIVE
SARS Coronavirus 2 by RT PCR: NEGATIVE

## 2022-09-12 MED ORDER — DOXYCYCLINE HYCLATE 100 MG PO CAPS: 100.0000 mg | ORAL_CAPSULE | Freq: Two times a day (BID) | ORAL | 0 refills | Status: DC

## 2022-09-12 MED ORDER — BENZONATATE 100 MG PO CAPS: 100.0000 mg | ORAL_CAPSULE | Freq: Three times a day (TID) | ORAL | 0 refills | Status: DC

## 2022-09-12 NOTE — ED Notes (Signed)
Pt refused the nasal swab and throat swab. Pt became visibly upset even after the process and reasoning was explained to pt. Pt states he wants to see a "real doctor" first.
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
## 2022-09-12 NOTE — ED Triage Notes (Signed)
Pt presents with productive cough and sore throat x 2 weeks. Pt also reports a lump on the R side of his neck. Denies fever or ShOB.

## 2022-09-12 NOTE — ED Notes (Addendum)
Upon returning from asking doctor about pain mgmt and getting order for knee sleeve pt was found to have left. DC paperwork had already been reviewed and DC VS obtained.

## 2022-09-12 NOTE — Discharge Instructions (Addendum)
Contact a health care provider if: °You have redness, swelling, or a feeling of warmth in a joint that gets worse. °You have a fever along with joint or muscle aches. °You develop a rash. °You have trouble doing your normal activities. °Get help right away if: °You have pain that gets worse and is not relieved by pain medicine. °

## 2022-09-12 NOTE — ED Provider Triage Note (Signed)
Emergency Medicine Provider Triage Evaluation Note  Benjamin Simpson , a 32 y.o. male  was evaluated in triage.  Pt complains of sore throat, cough and congestion x 2 weeks.  He has associated small bump on the back of his neck and right knee pain.  He states that he will not be evaluated in triage by a PA and wants to wait until he gets a bed and sees a physician.  Review of Systems  Positive:  Negative:   Physical Exam  BP 106/66 (BP Location: Right Arm)   Pulse 87   Temp 98.3 F (36.8 C)   Resp 17   Ht 6' (1.829 m)   Wt 72.6 kg   SpO2 96%   BMI 21.70 kg/m  Gen:   Awake, no distress   Resp:  Normal effort  MSK:   Moves extremities without difficulty  Other:  Small subcutaneous nodule right posterior neck  Medical Decision Making  Medically screening exam initiated at 12:38 PM.  Appropriate orders placed.  Benjamin Simpson was informed that the remainder of the evaluation will be completed by another provider, this initial triage assessment does not replace that evaluation, and the importance of remaining in the ED until their evaluation is complete.     Gwenevere Abbot, [place] 09/12/22 1240

## 2022-09-12 NOTE — ED Notes (Signed)
Patient demanded to go to his car to get his other phone.  Advised patient we do not let patients leave and go to their car he reports he will return.

## 2022-09-21 ENCOUNTER — Emergency Department (HOSPITAL_COMMUNITY)
Admission: EM | Admit: 2022-09-21 | Discharge: 2022-09-21 | Disposition: A | Discharge status: Home / Self Care | Payer: Medicaid Other | Attending: Emergency Medicine | Admitting: Emergency Medicine

## 2022-09-21 ENCOUNTER — Encounter (HOSPITAL_COMMUNITY): Payer: Self-pay

## 2022-09-21 ENCOUNTER — Other Ambulatory Visit: Payer: Self-pay

## 2022-09-21 DIAGNOSIS — R059 Cough, unspecified: Secondary | ICD-10-CM | POA: Diagnosis not present

## 2022-09-21 DIAGNOSIS — M25561 Pain in right knee: Secondary | ICD-10-CM | POA: Insufficient documentation

## 2022-09-21 DIAGNOSIS — R52 Pain, unspecified: Secondary | ICD-10-CM

## 2022-09-21 DIAGNOSIS — Z1152 Encounter for screening for COVID-19: Secondary | ICD-10-CM | POA: Diagnosis not present

## 2022-09-21 HISTORY — DX: Asymptomatic human immunodeficiency virus (hiv) infection status: Z21

## 2022-09-21 HISTORY — DX: Human immunodeficiency virus (HIV) disease: B20

## 2022-09-21 LAB — RESP PANEL BY RT-PCR (RSV, FLU A&B, COVID)  RVPGX2
Influenza A by PCR: NEGATIVE
Influenza B by PCR: NEGATIVE
Resp Syncytial Virus by PCR: NEGATIVE
SARS Coronavirus 2 by RT PCR: NEGATIVE

## 2022-09-21 MED ORDER — IBUPROFEN 400 MG PO TABS
600.0000 mg | ORAL_TABLET | ORAL | Status: AC
  Administered 2022-09-21: 600 mg via ORAL
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
## 2022-09-21 NOTE — ED Notes (Signed)
Pt requesting to see another provider. Refused his xray because his knee hurts too much. Pt has been ambulatory with his luggage with steady gait. Refusing provider assessment or nursing care. MD made aware
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
## 2022-09-21 NOTE — ED Notes (Signed)
Attempted to obtain vitals on pt, pt stating he was in too much pain to move and refused vitals at this time

## 2022-09-22 ENCOUNTER — Emergency Department (HOSPITAL_COMMUNITY)
Admission: EM | Admit: 2022-09-22 | Discharge: 2022-09-22 | Disposition: A | Discharge status: Home / Self Care | Payer: Medicaid Other | Attending: Emergency Medicine | Admitting: Emergency Medicine

## 2022-09-22 ENCOUNTER — Emergency Department (HOSPITAL_COMMUNITY): Payer: Medicaid Other

## 2022-09-22 DIAGNOSIS — M25561 Pain in right knee: Secondary | ICD-10-CM | POA: Insufficient documentation

## 2022-09-22 DIAGNOSIS — Z5900 Homelessness unspecified: Secondary | ICD-10-CM | POA: Diagnosis not present

## 2022-09-22 NOTE — ED Provider Notes (Signed)
[facility] Provider Note   CSN: [id number] Arrival date & time: 09/22/22  0235     History  Chief Complaint  Patient presents with   Knee Pain    Benjamin Simpson is a 32 y.o. male.  Pt request percocet or tramadol.  Pt reports knee pain since injury in January.  No new injury    The history is provided by the patient. No language interpreter was used.  Knee Pain Location:  Knee Time since incident:  1 month Injury: yes   Knee location:  R knee Pain details:    Quality:  Aching Relieved by:  Nothing Worsened by:  Nothing Ineffective treatments:  None tried Risk factors: no recent illness        Home Medications Prior to Admission medications   Medication Sig Start Date End Date Taking? Authorizing Provider  benzonatate (TESSALON) 100 MG capsule Take 1 capsule (100 mg total) by mouth every 8 (eight) hours. 09/12/22   Harris, Vernie Shanks, PA-C  bictegravir-emtricitabine-tenofovir AF (BIKTARVY) 50-200-25 MG TABS tablet Take 1 tablet by mouth daily. For HIV information 02/03/21   Ajibola, Ene A, NP  doxycycline (VIBRAMYCIN) 100 MG capsule Take 1 capsule (100 mg total) by mouth 2 (two) times daily. One po bid x 7 days 09/12/22   Margarita Mail, PA-C  olanzapine zydis (ZYPREXA) 15 MG disintegrating tablet Take 1 tablet (15 mg total) by mouth at bedtime. For mood control 02/03/21   Ajibola, Ene A, NP  traZODone (DESYREL) 50 MG tablet Take 1 tablet (50 mg total) by mouth at bedtime. For insomnia Patient not taking: No sig reported 02/03/21 02/03/21  Ophelia Shoulder, NP      Allergies    Patient has no known allergies.    Review of Systems   Review of Systems  All other systems reviewed and are negative.   Physical Exam Updated Vital Signs BP 104/65 (BP Location: Right Arm)   Pulse 69   Temp 97.8 F (36.6 C) (Oral)   Resp 18   Ht 6' (1.829 m)   Wt 70.3 kg   SpO2 96%   BMI 21.02 kg/m  Physical Exam Vitals and nursing note  reviewed.  Constitutional:      Appearance: He is well-developed.  HENT:     Head: Normocephalic.  Cardiovascular:     Rate and Rhythm: Normal rate.  Pulmonary:     Effort: Pulmonary effort is normal.  Abdominal:     General: There is no distension.  Musculoskeletal:        General: No tenderness or deformity.     Cervical back: Normal range of motion.  Skin:    General: Skin is warm.  Neurological:     General: No focal deficit present.     Mental Status: He is alert and oriented to person, place, and time.     ED Results / Procedures / Treatments   Labs (all labs ordered are listed, but only abnormal results are displayed) Labs Reviewed - No data to display  EKG None  Radiology No results found.  Procedures Procedures    Medications Ordered in ED Medications - No data to display  ED Course/ Medical Decision Making/ A&P                             Medical Decision Making Pt complains of knee pain since January. Pt reports he dislocated his knee.  Amount and/or Complexity of Data Reviewed Radiology: ordered and independent interpretation performed. Decision-making details documented in ED Course.    Details: Xray  no acute  Risk OTC drugs. Risk Details: Pt advised ED will not prescribing narcotics for chronic issues.  Pt reports being homeless.             Final Clinical Impression(s) / ED Diagnoses Final diagnoses:  Acute pain of right knee    Rx / DC Orders ED Discharge Orders     None     An After Visit Summary was printed and given to the patient.     Fransico Meadow, PA-C 09/22/22 [place], [place], DO 09/23/22 [phone]

## 2022-09-22 NOTE — ED Notes (Signed)
Pt is requesting two platters of food
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
## 2022-10-02 ENCOUNTER — Emergency Department (HOSPITAL_COMMUNITY): Payer: Medicaid Other

## 2022-10-02 ENCOUNTER — Other Ambulatory Visit: Payer: Self-pay

## 2022-10-02 ENCOUNTER — Encounter (HOSPITAL_COMMUNITY): Payer: Self-pay

## 2022-10-02 ENCOUNTER — Emergency Department (HOSPITAL_COMMUNITY)
Admission: EM | Admit: 2022-10-02 | Discharge: 2022-10-02 | Disposition: A | Discharge status: Home / Self Care | Payer: Medicaid Other | Attending: Emergency Medicine | Admitting: Emergency Medicine

## 2022-10-02 DIAGNOSIS — Z20822 Contact with and (suspected) exposure to covid-19: Secondary | ICD-10-CM | POA: Diagnosis not present

## 2022-10-02 DIAGNOSIS — J029 Acute pharyngitis, unspecified: Secondary | ICD-10-CM | POA: Diagnosis not present

## 2022-10-02 LAB — RESP PANEL BY RT-PCR (RSV, FLU A&B, COVID)  RVPGX2
Influenza A by PCR: NEGATIVE
Influenza B by PCR: NEGATIVE
Resp Syncytial Virus by PCR: NEGATIVE
SARS Coronavirus 2 by RT PCR: NEGATIVE

## 2022-10-02 MED ORDER — ACETAMINOPHEN 160 MG/5ML PO SOLN
1000.0000 mg | Freq: Once | ORAL | Status: DC
  Filled 2022-10-02: qty 40.6

## 2022-10-02 MED ORDER — LIDOCAINE VISCOUS HCL 2 % MT SOLN: 5.0000 mL | Freq: Three times a day (TID) | OROMUCOSAL | 0 refills | Status: DC | PRN

## 2022-10-02 MED ORDER — ALUM & MAG HYDROXIDE-SIMETH 200-200-20 MG/5ML PO SUSP
30.0000 mL | Freq: Once | ORAL | Status: DC
  Filled 2022-10-02: qty 30

## 2022-10-02 NOTE — ED Notes (Signed)
At bedside with Randal Buba, MD during assessment

## 2022-10-02 NOTE — ED Provider Notes (Signed)
[facility] Provider Note   CSN: [id number] Arrival date & time: 10/02/22  [id number]     History  Chief Complaint  Patient presents with   Sore Throat    Benjamin Simpson is a 32 y.o. male.  The history is provided by the patient.  Sore Throat This is a new problem. The current episode started 6 to 12 hours ago. The problem occurs constantly. The problem has not changed since onset.Pertinent negatives include no chest pain, no abdominal pain, no headaches and no shortness of breath. Nothing aggravates the symptoms. Nothing relieves the symptoms. The treatment provided no relief.  Patient seen and discharged by me and did not leave the waiting room.  He refused medications and now checked back in as his pain continues.       Home Medications Prior to Admission medications   Medication Sig Start Date End Date Taking? Authorizing Provider  magic mouthwash (lidocaine, diphenhydrAMINE, alum & mag hydroxide) suspension Swish and spit 5 mLs 3 (three) times daily as needed for mouth pain. 10/02/22  Yes Corianne Buccellato, MD  benzonatate (TESSALON) 100 MG capsule Take 1 capsule (100 mg total) by mouth every 8 (eight) hours. 09/12/22   Harris, Vernie Shanks, PA-C  bictegravir-emtricitabine-tenofovir AF (BIKTARVY) 50-200-25 MG TABS tablet Take 1 tablet by mouth daily. For HIV information 02/03/21   Ajibola, Ene A, NP  doxycycline (VIBRAMYCIN) 100 MG capsule Take 1 capsule (100 mg total) by mouth 2 (two) times daily. One po bid x 7 days 09/12/22   Margarita Mail, PA-C  olanzapine zydis (ZYPREXA) 15 MG disintegrating tablet Take 1 tablet (15 mg total) by mouth at bedtime. For mood control 02/03/21   Ajibola, Ene A, NP  traZODone (DESYREL) 50 MG tablet Take 1 tablet (50 mg total) by mouth at bedtime. For insomnia Patient not taking: No sig reported 02/03/21 02/03/21  Ophelia Shoulder, NP      Allergies    Patient has no known allergies.    Review of Systems   Review  of Systems  Constitutional:  Negative for fever.  HENT:  Positive for sore throat. Negative for trouble swallowing and voice change.   Eyes:  Negative for redness.  Respiratory:  Negative for shortness of breath.   Cardiovascular:  Negative for chest pain.  Gastrointestinal:  Negative for abdominal pain.  Neurological:  Negative for headaches.  All other systems reviewed and are negative.   Physical Exam Updated Vital Signs BP 120/78 (BP Location: Right Arm)   Pulse 72   Temp 98.4 F (36.9 C) (Oral)   Resp 16   SpO2 99%  Physical Exam Vitals and nursing note reviewed. Exam conducted with a chaperone present.  Constitutional:      General: He is not in acute distress.    Appearance: He is well-developed. He is not diaphoretic.  HENT:     Head: Normocephalic and atraumatic.     Nose: Nose normal.     Mouth/Throat:     Mouth: Mucous membranes are moist.     Pharynx: Oropharynx is clear. No oropharyngeal exudate or posterior oropharyngeal erythema.  Eyes:     Conjunctiva/sclera: Conjunctivae normal.     Pupils: Pupils are equal, round, and reactive to light.  Cardiovascular:     Rate and Rhythm: Normal rate and regular rhythm.  Pulmonary:     Effort: Pulmonary effort is normal.     Breath sounds: Normal breath sounds. No stridor. No wheezing or rales.  Abdominal:     General: Bowel sounds are normal.     Palpations: Abdomen is soft.     Tenderness: There is no abdominal tenderness. There is no guarding or rebound.  Musculoskeletal:        General: Normal range of motion.     Cervical back: Normal range of motion and neck supple. No rigidity.  Lymphadenopathy:     Cervical: No cervical adenopathy.  Skin:    General: Skin is warm and dry.  Neurological:     Mental Status: He is alert and oriented to person, place, and time.     ED Results / Procedures / Treatments   Labs (all labs ordered are listed, but only abnormal results are displayed) Labs Reviewed - No data  to display  EKG None  Radiology DG Neck Soft Tissue  Result Date: 10/02/2022 CLINICAL DATA:  Sore throat and difficulty swallowing EXAM: NECK SOFT TISSUES - 1+ VIEW COMPARISON:  None Available. FINDINGS: Epiglottis and aryepiglottic folds are within normal limits. No prevertebral soft tissue swelling is noted. No radiopaque foreign body is seen. Mild degenerative changes of the cervical spine are seen. IMPRESSION: No acute abnormality noted. Electronically Signed   By: Inez Catalina M.D.   On: 10/02/2022 00:40    Procedures Procedures    Medications Ordered in ED Medications - No data to display  ED Course/ Medical Decision Making/ A&P                             Medical Decision Making Checked back in for sore throat   Amount and/or Complexity of Data Reviewed External Data Reviewed: notes.    Details: Previous notes reviewed   Risk Risk Details: Based on the CENTOR criteria there is no indication for additional testing and or treatment.  There is no swelling, there are no lymph nodes, there is no thrush.  This all started suddenly when he reported that something was in his food.      Final Clinical Impression(s) / ED Diagnoses Final diagnoses:  Sore throat    Return for intractable cough, coughing up blood, fevers > 100.4 unrelieved by medication, shortness of breath, intractable vomiting, chest pain, shortness of breath, weakness, numbness, changes in speech, facial asymmetry, abdominal pain, passing out, Inability to tolerate liquids or food, cough, altered mental status or any concerns. No signs of systemic illness or infection. The patient is nontoxic-appearing on exam and vital signs are within normal limits.  I have reviewed the triage vital signs and the nursing notes. Pertinent labs & imaging results that were available during my care of the patient were reviewed by me and considered in my medical decision making (see chart for details). After history, exam, and  medical workup I feel the patient has been appropriately medically screened and is safe for discharge home. Pertinent diagnoses were discussed with the patient. Patient was given return precautions.  Rx / DC Orders ED Discharge Orders          Ordered    magic mouthwash (lidocaine, diphenhydrAMINE, alum & mag hydroxide) suspension  3 times daily PRN        10/02/22 0230              Elianie Hubers, MD 10/02/22 [id number]

## 2022-10-02 NOTE — ED Notes (Signed)
Patient refused medication, he states that it is not going to help.

## 2022-10-02 NOTE — ED Provider Notes (Signed)
Patient was belligerent on both visits.  Yelling out and demanding medication and then refusing it.  Initially he was angry because he stated his throat was swelling shut and then changes to pain.  He is now demanding antibiotics and a place to stay.  There are no indications for antibiotics.  I have prescribed magic mouthwash as there is nothing in the oral cavity, there are no lymph nodes and there is no swelling on exam or imaging.  After I left the room patient is screaming "Ruba Outen" and demanding I reenter the room.  I have now seen him multiple times and exam is unchanged.  He was stable and has been discharge.  Patient is    Benjamin Simpson, [name], MD 10/02/22 [phone]

## 2022-10-02 NOTE — ED Triage Notes (Signed)
Patient feels like he has swollen lymph nodes, he has been sick for about a week.
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
## 2022-10-02 NOTE — ED Triage Notes (Signed)
Pt checked back in after refusing to leave lobby requesting more meds

## 2022-10-09 ENCOUNTER — Emergency Department (HOSPITAL_COMMUNITY): Payer: Medicaid Other

## 2022-10-09 ENCOUNTER — Other Ambulatory Visit: Payer: Self-pay

## 2022-10-09 ENCOUNTER — Encounter (HOSPITAL_COMMUNITY): Payer: Self-pay

## 2022-10-09 ENCOUNTER — Emergency Department (HOSPITAL_COMMUNITY)
Admission: EM | Admit: 2022-10-09 | Discharge: 2022-10-09 | Disposition: A | Discharge status: Home / Self Care | Payer: Medicaid Other | Attending: Emergency Medicine | Admitting: Emergency Medicine

## 2022-10-09 DIAGNOSIS — R4182 Altered mental status, unspecified: Secondary | ICD-10-CM | POA: Insufficient documentation

## 2022-10-09 DIAGNOSIS — M542 Cervicalgia: Secondary | ICD-10-CM | POA: Diagnosis not present

## 2022-10-09 MED ORDER — SODIUM CHLORIDE 0.9 % IV BOLUS: 1000.0000 mL | Freq: Once | INTRAVENOUS | Status: DC

## 2022-10-09 NOTE — ED Notes (Signed)
Patient refused to get blood work done. Pt stated "I'm not gonna give my blood , Im ok". P.A. Informed.
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
## 2022-10-09 NOTE — ED Provider Notes (Signed)
[place] EMERGENCY DEPARTMENT AT [facility] Provider Note   CSN: [id number] Arrival date & time: 10/09/22  1534     History  Chief Complaint  Patient presents with   Altered Mental Status    Benjamin Simpson is a 32 y.o. male with a past medical history of paranoid schizophrenia presenting today via [employer].  Patient is a resident of the [facility] and they called out saying the patient was unconscious.  Bystanders reportedly noted he was banging his head against a wall before this happened.  He does complain of neck pain but he tells me it is because he accidentally bumped it against something.  Denies alcohol or drug use and says that he is sober.  Says that he has been taking his Biktarvy for his HIV.  Currently alert and oriented x 3.   Altered Mental Status      Home Medications Prior to Admission medications   Medication Sig Start Date End Date Taking? Authorizing Provider  benzonatate (TESSALON) 100 MG capsule Take 1 capsule (100 mg total) by mouth every 8 (eight) hours. 09/12/22   Harris, Vernie Shanks, PA-C  bictegravir-emtricitabine-tenofovir AF (BIKTARVY) 50-200-25 MG TABS tablet Take 1 tablet by mouth daily. For HIV information 02/03/21   Ajibola, Ene A, NP  doxycycline (VIBRAMYCIN) 100 MG capsule Take 1 capsule (100 mg total) by mouth 2 (two) times daily. One po bid x 7 days 09/12/22   Margarita Mail, PA-C  magic mouthwash (lidocaine, diphenhydrAMINE, alum & mag hydroxide) suspension Swish and spit 5 mLs 3 (three) times daily as needed for mouth pain. 10/02/22   Palumbo, April, MD  olanzapine zydis (ZYPREXA) 15 MG disintegrating tablet Take 1 tablet (15 mg total) by mouth at bedtime. For mood control 02/03/21   Ajibola, Ene A, NP  traZODone (DESYREL) 50 MG tablet Take 1 tablet (50 mg total) by mouth at bedtime. For insomnia Patient not taking: No sig reported 02/03/21 02/03/21  Ophelia Shoulder, NP      Allergies    Patient has no known allergies.    Review of Systems    Review of Systems  Physical Exam Updated Vital Signs BP 124/79   Pulse 84   Temp 97.7 F (36.5 C) (Oral)   Resp 16   Ht 6' (1.829 m)   Wt 70.3 kg   SpO2 100%   BMI 21.02 kg/m  Physical Exam Vitals and nursing note reviewed.  Constitutional:      General: He is not in acute distress.    Appearance: Normal appearance. He is not ill-appearing.  HENT:     Head: Normocephalic and atraumatic.     Mouth/Throat:     Mouth: Mucous membranes are dry.  Eyes:     General: No scleral icterus.    Conjunctiva/sclera: Conjunctivae normal.  Cardiovascular:     Rate and Rhythm: Normal rate and regular rhythm.  Pulmonary:     Effort: Pulmonary effort is normal. No respiratory distress.  Abdominal:     General: Abdomen is flat.     Palpations: Abdomen is soft.     Tenderness: There is no abdominal tenderness.  Musculoskeletal:     Right lower leg: No edema.     Left lower leg: No edema.  Skin:    Findings: No rash.  Neurological:     Mental Status: He is alert and oriented to person, place, and time.     Cranial Nerves: No cranial nerve deficit.     Motor: No weakness.  Coordination: Coordination normal.     Comments: Cranial nerves II through XII grossly intact.  Alert and oriented without pressured speech.  Seems somewhat paranoid about a missing bag but otherwise no paranoia.  Psychiatric:        Mood and Affect: Mood normal.     ED Results / Procedures / Treatments   Labs (all labs ordered are listed, but only abnormal results are displayed) Labs Reviewed - No data to display  EKG None  Radiology No results found.  Procedures Procedures    Medications Ordered in ED Medications - No data to display  ED Course/ Medical Decision Making/ A&P Clinical Course as of 10/09/22 1607  Thu Oct 09, 2022  1552 Per nursing patient is refusing labwork. A&O and will not IVC or force labs [MR]    Clinical Course User Index [MR] Bernadette Gores, Cecilio Asper, PA-C                              Medical Decision Making Amount and/or Complexity of Data Reviewed Labs: ordered. Radiology: ordered.   32 year old male presenting today with reported AMS. A&O x3 for me.  Differential includes but is not limited to hyperglycemia, HHS, intoxication, drug use.  This is not an exhaustive differential.    Past Medical History / Co-morbidities / Social History: Paranoid schizophrenia   Additional history: Per chart review patient is on [place] for HIV.  Zyprexa was prescribed in 2022, unclear whether or not he still takes this medication.  No other psychiatric medications on his list  Physical Exam: Pertinent physical exam findings include Normal neuro A&O, following commands    MDM/Disposition: This is a 32 year old male with a past medical history of paranoid schizophrenia who is alert and oriented and following commands today presenting from the [facility] due to altered mental status.  He was alert on my exam.  I was notified by nursing staff that he was refusing labs.  He subsequently refused imaging and ambulated out of the department.  He was not under arrest and I did not IVC him due to his normal presentation with me.  Marked as eloped.  Final Clinical Impression(s) / ED Diagnoses Final diagnoses:  Altered mental status, unspecified altered mental status type    Rx / DC Orders ED Discharge Orders     None         Kaven Cumbie, Cecilio Asper, PA-C 10/09/22 1609    Dorie Rank, MD 10/10/22 [phone]

## 2022-10-09 NOTE — ED Notes (Addendum)
Pt. Is refusing to stay. He states he wants his belongings and he is leaving to go and get it. He pulled his IV. PA was notified. Pt. Refused to sign AMA form

## 2022-11-14 ENCOUNTER — Ambulatory Visit (HOSPITAL_COMMUNITY): Payer: Self-pay

## 2022-11-15 ENCOUNTER — Emergency Department (HOSPITAL_COMMUNITY)
Admission: EM | Admit: 2022-11-15 | Discharge: 2022-11-15 | Disposition: A | Discharge status: Home / Self Care | Payer: Medicaid Other | Source: Home / Self Care | Attending: Emergency Medicine | Admitting: Emergency Medicine

## 2022-11-15 ENCOUNTER — Other Ambulatory Visit: Payer: Self-pay

## 2022-11-15 ENCOUNTER — Emergency Department (HOSPITAL_COMMUNITY)
Admission: EM | Admit: 2022-11-15 | Discharge: 2022-11-15 | Discharge status: Against Medical Advice | Payer: Medicaid Other | Attending: Emergency Medicine | Admitting: Emergency Medicine

## 2022-11-15 ENCOUNTER — Encounter (HOSPITAL_COMMUNITY): Payer: Self-pay

## 2022-11-15 DIAGNOSIS — Z1152 Encounter for screening for COVID-19: Secondary | ICD-10-CM | POA: Insufficient documentation

## 2022-11-15 DIAGNOSIS — Z5329 Procedure and treatment not carried out because of patient's decision for other reasons: Secondary | ICD-10-CM | POA: Insufficient documentation

## 2022-11-15 DIAGNOSIS — Z5321 Procedure and treatment not carried out due to patient leaving prior to being seen by health care provider: Secondary | ICD-10-CM

## 2022-11-15 DIAGNOSIS — J069 Acute upper respiratory infection, unspecified: Secondary | ICD-10-CM

## 2022-11-15 DIAGNOSIS — Z21 Asymptomatic human immunodeficiency virus [HIV] infection status: Secondary | ICD-10-CM | POA: Insufficient documentation

## 2022-11-15 DIAGNOSIS — T450X1A Poisoning by antiallergic and antiemetic drugs, accidental (unintentional), initial encounter: Secondary | ICD-10-CM | POA: Diagnosis present

## 2022-11-15 DIAGNOSIS — Z20822 Contact with and (suspected) exposure to covid-19: Secondary | ICD-10-CM | POA: Insufficient documentation

## 2022-11-15 LAB — RESP PANEL BY RT-PCR (RSV, FLU A&B, COVID)  RVPGX2
Influenza A by PCR: NEGATIVE
Influenza B by PCR: NEGATIVE
Resp Syncytial Virus by PCR: NEGATIVE
SARS Coronavirus 2 by RT PCR: NEGATIVE

## 2022-11-15 MED ORDER — IBUPROFEN 400 MG PO TABS: 400.0000 mg | ORAL_TABLET | Freq: Four times a day (QID) | ORAL | 0 refills | Status: DC | PRN

## 2022-11-15 MED ORDER — GUAIFENESIN 100 MG/5ML PO LIQD: 5.0000 mL | ORAL | 0 refills | Status: DC | PRN

## 2022-11-15 NOTE — ED Triage Notes (Signed)
"  Bmp, mag, EKG, cardiac monitoring x 4 hrs" per poison control

## 2022-11-15 NOTE — Discharge Instructions (Addendum)
You are seen in the emergency department today for flulike symptoms.  Your respiratory viral panel test was negative so you do not have flu, COVID-19, RSV.  You likely have some other kind of viral upper respiratory infection but I would encourage you to manage symptoms best he can with over-the-counter options such as Tylenol, ibuprofen, Aleve for fever and bodyaches or Mucinex for congestion.  If you begin to experience worsening of your symptoms and develop significant shortness of breath or chest pain, please return to the emergency department for further evaluation.

## 2022-11-15 NOTE — ED Triage Notes (Signed)
"  Picked up from [facility], he was sneezing around 8pm last night. Took 50mg  of benadryl every time he sneezed since then, last dose around 0930. Approximately a total of 450mg  taken over those 13hrs. When patient realized he felt palpitations, and dizziness, he thought he should call EMS because he took too much" per EMS  Denies suicidal or homicidal ideation.

## 2022-11-15 NOTE — ED Notes (Signed)
Pt came out of room asking for directions to the car parking lot. Pt has not returned since.

## 2022-11-15 NOTE — ED Notes (Signed)
Patient A&O x 4. Gait steady. Stated he was ready to leave. Encouraged to stay for treatment. Walked out without signing. Provider informed.

## 2022-11-15 NOTE — ED Provider Notes (Signed)
[place] [facility] [place] Provider Note   CSN: [id number] Arrival date & time: 11/15/22  1038     History Chief Complaint  Patient presents with   Ingestion    Benjamin Simpson is a 32 y.o. male with h/o paranoid schizophrenia and HIV presents to the ER for evaluation after overdose on Bendaryl. Initially, the patient reports that he took twelve 25mg  Benadryl last night around 2000. He reports that he was sneezing and didn't think one would help him. He denies any suicide attempt or any suicidal ideations or homicidal ideations.  Patient reports that it "got him high".  He reports that he has known people to take upwards of 30 to get high and he did not think this little dose would do a forearm however reports that he was high and felt like he was going to die but is now been feeling better but just wanted to be checked out.  He denies any chest pain or shortness of breath.  Denies any syncope.  He denies any hallucinations.  He denies any fatigue or sleepiness.  From reading the nursing note, he told nursing that he started taking the pills around 8 PM and took 50 mg of Benadryl every time he sneezed with the last dose being 930 this morning.  Approximately 4 to 50 mg taken over those 13 hours.  He said he felt dizziness and palpitations and called EMS.  Patient denies this to me and just told me he took 12 pills all at once around 2000 last night.   Ingestion Pertinent negatives include no chest pain, no abdominal pain and no shortness of breath.       Home Medications Prior to Admission medications   Medication Sig Start Date End Date Taking? Authorizing Provider  benzonatate (TESSALON) 100 MG capsule Take 1 capsule (100 mg total) by mouth every 8 (eight) hours. 09/12/22   Harris, Cammy CopaAbigail, PA-C  bictegravir-emtricitabine-tenofovir AF (BIKTARVY) 50-200-25 MG TABS tablet Take 1 tablet by mouth daily. For HIV information 02/03/21   Ajibola, Ene A, NP   doxycycline (VIBRAMYCIN) 100 MG capsule Take 1 capsule (100 mg total) by mouth 2 (two) times daily. One po bid x 7 days 09/12/22   Arthor CaptainHarris, Abigail, PA-C  magic mouthwash (lidocaine, diphenhydrAMINE, alum & mag hydroxide) suspension Swish and spit 5 mLs 3 (three) times daily as needed for mouth pain. 10/02/22   Palumbo, April, MD  olanzapine zydis (ZYPREXA) 15 MG disintegrating tablet Take 1 tablet (15 mg total) by mouth at bedtime. For mood control 02/03/21   Ajibola, Ene A, NP  traZODone (DESYREL) 50 MG tablet Take 1 tablet (50 mg total) by mouth at bedtime. For insomnia Patient not taking: No sig reported 02/03/21 02/03/21  Maricela BoAjibola, Ene A, NP      Allergies    Patient has no known allergies.    Review of Systems   Review of Systems  Constitutional:  Negative for chills and fever.  HENT:  Positive for sneezing.   Respiratory:  Negative for shortness of breath.   Cardiovascular:  Negative for chest pain.  Gastrointestinal:  Negative for abdominal pain, nausea and vomiting.  Neurological:  Negative for syncope.  Psychiatric/Behavioral:  Negative for hallucinations and suicidal ideas. The patient is not nervous/anxious.     Physical Exam Updated Vital Signs BP 120/84   Pulse 77   Temp 98.2 F (36.8 C)   Resp 18   Ht 6' (1.829 m)   Wt 69.9 kg  SpO2 100%   BMI 20.89 kg/m  Physical Exam Vitals and nursing note reviewed.  Constitutional:      General: He is not in acute distress.    Appearance: Normal appearance. He is not ill-appearing or toxic-appearing.     Comments: In no acute distress, headphones on .   Eyes:     General: No scleral icterus. Pulmonary:     Effort: Pulmonary effort is normal. No respiratory distress.  Skin:    General: Skin is dry.     Findings: No rash.  Neurological:     General: No focal deficit present.     Mental Status: He is alert. Mental status is at baseline.  Psychiatric:        Mood and Affect: Mood normal.        Speech: Speech normal.         Thought Content: Thought content does not include homicidal or suicidal ideation. Thought content does not include homicidal or suicidal plan.     Comments: Does not appear to be responding to any internal stimuli. Conversational.      ED Results / Procedures / Treatments   Labs (all labs ordered are listed, but only abnormal results are displayed) Labs Reviewed  COMPREHENSIVE METABOLIC PANEL  CBC WITH DIFFERENTIAL/PLATELET  ACETAMINOPHEN LEVEL  SALICYLATE LEVEL  MAGNESIUM    EKG None  Radiology No results found.  Procedures Procedures   Medications Ordered in ED Medications - No data to display  ED Course/ Medical Decision Making/ A&P                            Medical Decision Making Amount and/or Complexity of Data Reviewed Labs: ordered.   32 y.o. male presents to the ER today for evaluation of potential Benadryl overdose. Differential diagnosis includes but is not limited to Prolonged QT, anticholinergic symptoms, electrolyte abnormalities, EKG changes. Vital signs unremarkable. Physical exam as noted above.   Nursing discussed patient with poison control they recommended BMP, mag, EKG, and cardiac monitoring for 4 hours.  I discussed this plan with the patient and he was amenable.    A short while later he discussed with nursing that he was refusing labs and was ready to leave and eloped from the emergency department.  The patient does have a history of schizophrenia however his affect was normal and he was cooperative with our conversation.  He denies taking his medications for any suicidal or homicidal ideations.  He denied any hallucinations nor did he appear to be responding to any internal stimuli.  I do not see a need to IVC him.  Patient eloped.   Portions of this report may have been transcribed using voice recognition software. Every effort was made to ensure accuracy; however, inadvertent computerized transcription errors may be present.   Final  Clinical Impression(s) / ED Diagnoses Final diagnoses:  Eloped from emergency department    Rx / DC Orders ED Discharge Orders     None         Achille Rich, PA-C 11/15/22 1749    Derwood Kaplan, MD 11/16/22 1104

## 2022-11-15 NOTE — ED Notes (Signed)
Pt returned to room with belongings.

## 2022-11-15 NOTE — ED Triage Notes (Addendum)
Pt c/o runny nose, cough x 4-5 days; denies fevers; states he lives at [facility], endorses possible sick contacts; pt asking if we can arrange for transport back to [facility] because he has too many bags to ride the bus

## 2022-11-15 NOTE — ED Provider Notes (Signed)
[place] [facility] Provider Note   CSN: [id number] Arrival date & time: 11/15/22  1441     History No chief complaint on file.   Benjamin Simpson is a 32 y.o. male.  Patient presents the emergency department complaints of flulike symptoms.  Reports this has been going on off and on for the last month or so.  He [facility] and has had multiple sick contacts during this period of time.  Denies any notable fevers but does report some associated cough, congestion, runny nose.  Patient denies any recent sexual activity and states that he has not any sexual partners in the last 3 years.  Patient with history of HIV and managed with Biktarvy. No IV drug use.  Denies any other symptoms such as dysuria, hematuria, increased urinary frequency and urgency.  HPI     Home Medications Prior to Admission medications   Medication Sig Start Date End Date Taking? Authorizing Provider  guaiFENesin (ROBITUSSIN) 100 MG/5ML liquid Take 5 mLs by mouth every 4 (four) hours as needed for cough or to loosen phlegm. 11/15/22  Yes Smitty KnudsenZelaya, Keishon Chavarin A, PA-C  ibuprofen (ADVIL) 400 MG tablet Take 1 tablet (400 mg total) by mouth every 6 (six) hours as needed. 11/15/22  Yes Smitty KnudsenZelaya, Eilis Chestnutt A, PA-C  benzonatate (TESSALON) 100 MG capsule Take 1 capsule (100 mg total) by mouth every 8 (eight) hours. 09/12/22   Harris, Cammy CopaAbigail, PA-C  bictegravir-emtricitabine-tenofovir AF (BIKTARVY) 50-200-25 MG TABS tablet Take 1 tablet by mouth daily. For HIV information 02/03/21   Ajibola, Ene A, NP  doxycycline (VIBRAMYCIN) 100 MG capsule Take 1 capsule (100 mg total) by mouth 2 (two) times daily. One po bid x 7 days 09/12/22   Arthor CaptainHarris, Abigail, PA-C  magic mouthwash (lidocaine, diphenhydrAMINE, alum & mag hydroxide) suspension Swish and spit 5 mLs 3 (three) times daily as needed for mouth pain. 10/02/22   Palumbo, April, MD  olanzapine zydis (ZYPREXA) 15 MG disintegrating tablet Take 1 tablet  (15 mg total) by mouth at bedtime. For mood control 02/03/21   Ajibola, Ene A, NP  traZODone (DESYREL) 50 MG tablet Take 1 tablet (50 mg total) by mouth at bedtime. For insomnia Patient not taking: No sig reported 02/03/21 02/03/21  Maricela BoAjibola, Ene A, NP      Allergies    Patient has no known allergies.    Review of Systems   Review of Systems  Respiratory:  Positive for cough.   All other systems reviewed and are negative.   Physical Exam Updated Vital Signs BP 100/65 (BP Location: Right Arm)   Pulse (!) 57   Temp 97.9 F (36.6 C) (Oral)   Resp 16   SpO2 100%  Physical Exam Vitals and nursing note reviewed.  Constitutional:      General: He is not in acute distress.    Appearance: He is well-developed.  HENT:     Head: Normocephalic and atraumatic.  Eyes:     Conjunctiva/sclera: Conjunctivae normal.  Cardiovascular:     Rate and Rhythm: Normal rate and regular rhythm.     Heart sounds: No murmur heard. Pulmonary:     Effort: Pulmonary effort is normal. No respiratory distress.     Breath sounds: No wheezing or rales.  Abdominal:     Palpations: Abdomen is soft.     Tenderness: There is no abdominal tenderness.  Musculoskeletal:        General: No swelling.  Cervical back: Neck supple.  Skin:    General: Skin is warm and dry.     Capillary Refill: Capillary refill takes less than 2 seconds.  Neurological:     Mental Status: He is alert.  Psychiatric:        Mood and Affect: Mood normal.     ED Results / Procedures / Treatments   Labs (all labs ordered are listed, but only abnormal results are displayed) Labs Reviewed  RESP PANEL BY RT-PCR (RSV, FLU A&B, COVID)  [id number]    EKG None  Radiology No results found.  Procedures Procedures   Medications Ordered in ED Medications - No data to display  ED Course/ Medical Decision Making/ A&P                           Medical Decision Making  This patient presents to the ED for concern of flulike  symptoms.  Differential diagnosis includes influenza, COVID-19, RSV, pneumonia, bronchitis, viral URI   Lab Tests:  I Ordered, and personally interpreted labs.  The pertinent results include: Negative respiratory viral panel.  Problem List / ED Course:  Patient presented to the emergency department complaints of flulike symptoms.  Reports this been occurring somewhat off-and-on for the last few days but he has also been feeling sick for the past month or so and intervals.  He denies any fevers but does report a cough and some congestion.  Denies any history of allergies.  Patient is currently homeless and was at the [facility] so he does believe that he has had multiple sick contacts due to this.  Respiratory viral panel was ordered and was negative.  Discussed other possible factors for symptoms such as HIV and patient denies any sexual activity and states he has not a sexual partner last 3 years or so.  Also denies IV drug use.  On my exam, lungs were clear to auscultation with no obvious wheezing or crackles making my suspicion or concern for bronchitis or pneumonia very low at this time.  Patient denies any notable shortness of breath or chest pain.  At this time believe that this is most likely to be some other viral upper respiratory infection or potentially seasonal allergies that are not manageable.  Will send prescriptions to patient's pharmacy for some cough syrup as well as pain relievers as he does not have access to his medications.  Patient agreeable treatment plan verbalized understanding return precautions.  All questions answered prior to patient discharge.   Social Determinants of Health:  Patient is currently homeless  Final Clinical Impression(s) / ED Diagnoses Final diagnoses:  Viral URI with cough    Rx / DC Orders ED Discharge Orders          Ordered    guaiFENesin (ROBITUSSIN) 100 MG/5ML liquid  Every 4 hours PRN        11/15/22 1752    ibuprofen (ADVIL) 400 MG tablet   Every 6 hours PRN        11/15/22 1752              Smitty Knudsen, PA-C 11/15/22 1753    Linwood Dibbles, MD 11/16/22 1010

## 2022-11-24 ENCOUNTER — Emergency Department (HOSPITAL_COMMUNITY): Payer: Medicaid Other

## 2022-11-24 ENCOUNTER — Encounter (HOSPITAL_COMMUNITY): Payer: Self-pay

## 2022-11-24 ENCOUNTER — Emergency Department (HOSPITAL_COMMUNITY)
Admission: EM | Admit: 2022-11-24 | Discharge: 2022-11-24 | Disposition: A | Discharge status: Home / Self Care | Payer: Medicaid Other | Attending: Emergency Medicine | Admitting: Emergency Medicine

## 2022-11-24 ENCOUNTER — Ambulatory Visit (HOSPITAL_COMMUNITY)
Admission: EM | Admit: 2022-11-24 | Discharge: 2022-11-24 | Disposition: A | Discharge status: Home / Self Care | Payer: Medicaid Other | Attending: Physician Assistant | Admitting: Physician Assistant

## 2022-11-24 DIAGNOSIS — R0602 Shortness of breath: Secondary | ICD-10-CM | POA: Insufficient documentation

## 2022-11-24 DIAGNOSIS — R051 Acute cough: Secondary | ICD-10-CM | POA: Insufficient documentation

## 2022-11-24 DIAGNOSIS — R079 Chest pain, unspecified: Secondary | ICD-10-CM | POA: Insufficient documentation

## 2022-11-24 DIAGNOSIS — J069 Acute upper respiratory infection, unspecified: Secondary | ICD-10-CM | POA: Diagnosis not present

## 2022-11-24 DIAGNOSIS — Z7689 Persons encountering health services in other specified circumstances: Secondary | ICD-10-CM

## 2022-11-24 MED ORDER — NAPROXEN 375 MG PO TABS: 375.0000 mg | ORAL_TABLET | Freq: Two times a day (BID) | ORAL | 0 refills | Status: DC

## 2022-11-24 MED ORDER — SULFAMETHOXAZOLE-TRIMETHOPRIM 800-160 MG PO TABS: 1.0000 | ORAL_TABLET | Freq: Two times a day (BID) | ORAL | 0 refills | Status: AC

## 2022-11-24 NOTE — Discharge Instructions (Signed)
Likely a viral infection, recommend over-the-counter pain medications like ibuprofen Tylenol for fever and pain control, nasal decongestions like Flonase and Zyrtec, Mucinex for cough.  If not eating recommend supplementing with Gatorade to help with electrolyte supplementation.  Follow-up PCP for further evaluation.  Come back to the emergency department if you develop chest pain, shortness of breath, severe abdominal pain, uncontrolled nausea, vomiting, diarrhea.  

## 2022-11-24 NOTE — ED Notes (Signed)
Pt not responding to provider by voice or light touch. Pt now upset because provider tested pt's responsiveness to painful stimuli after not responding initially.

## 2022-11-24 NOTE — ED Notes (Signed)
Pt overheard telling CNA that he may blackout and don't be offended if I don't respond.

## 2022-11-24 NOTE — ED Triage Notes (Signed)
Pt presents to the office for cough,congestion and diarrhea. X 5 days. Pt stated he feels like he has pneumonia. Pt would like a referral to [facility] to get back on his HIV medicine.

## 2022-11-24 NOTE — ED Notes (Signed)
Patient rolled to the lobby in wheelchair, placed at front desk to use the phone

## 2022-11-24 NOTE — ED Notes (Signed)
When completing triage assessment, pt states "tell the doctor to put me at the bottom of the list". Informed pt that this is the emergency department and we do not delay care to pts. Pt states he was kicked out of [facility] and has been outside for 4 hours and thinks that being outside in the cold is what caused his symptoms.

## 2022-11-24 NOTE — ED Triage Notes (Signed)
Pt BIB [facility] from [facility] with c/o SOB, chest pain, and heart racing x1 day. Unremarkable 12 lead with EMS. Pt states he feels like he has pneumonia due to similar symptoms with past diagnosis.

## 2022-11-24 NOTE — ED Notes (Signed)
Pt refusing lab work stating he has pneumonia and needs to be admitted. Provider at bedside and attempting to explain to pt that we need to run tests to diagnose pt. Pt agreeable to chest xray only at this time.

## 2022-11-24 NOTE — ED Provider Notes (Signed)
[facility]    CSN: [id number] Arrival date & time: 11/24/22  4098      History   Chief Complaint Chief Complaint  Patient presents with   Cough   Nasal Congestion   Diarrhea    HPI Benjamin Simpson is a 32 y.o. male.   32 year old male presents with cough, congestion, and needing a referral.  Patient indicates that he was seen at [facility] ER last night and was diagnosed with pneumonia.  He indicates that he was sent from the emergency room in [place] over to the hospital in [facility] and he was supposed to be admitted however he got to the hospital too late and was not admitted.  Patient indicates he then came to [facility] health ER and was treated and released.  Patient returns to urgent care today indicating that he is concerned that he has pneumonia, coughing, chest congestion, production is thick and green.  Patient indicates he is not having fever or chills, but does have weakness.  Patient indicates that he is homeless and has been living on the street for the past several weeks.  He is without wheezing or shortness of breath. Patient indicates that he is HIV positive and has been sober for the past 10 years.  He indicates that he was taking Biktarvy but has been without the medication for the past 30 days.  Patient is requesting to be referred to infectious disease specialist for evaluation of his HIV status and to restart his Biktarvy.  Patient also request for social services support.  He is tolerating fluids well and was without nausea or vomiting.   Cough Diarrhea   Past Medical History:  Diagnosis Date   HIV (human immunodeficiency virus infection)     Patient Active Problem List   Diagnosis Date Noted   Severe episode of recurrent major depressive disorder, without psychotic features    Paranoid schizophrenia 01/10/2021   Schizophrenia, paranoid 01/09/2021    No past surgical history on file.     Home Medications    Prior to  Admission medications   Medication Sig Start Date End Date Taking? Authorizing Provider  naproxen (NAPROSYN) 375 MG tablet Take 1 tablet (375 mg total) by mouth 2 (two) times daily. 11/24/22  Yes Ellsworth Lennox, PA-C  sulfamethoxazole-trimethoprim (BACTRIM DS) 800-160 MG tablet Take 1 tablet by mouth 2 (two) times daily for 7 days. 11/24/22 12/01/22 Yes Ellsworth Lennox, PA-C  benzonatate (TESSALON) 100 MG capsule Take 1 capsule (100 mg total) by mouth every 8 (eight) hours. 09/12/22   Harris, Cammy Copa, PA-C  bictegravir-emtricitabine-tenofovir AF (BIKTARVY) 50-200-25 MG TABS tablet Take 1 tablet by mouth daily. For HIV information 02/03/21   Ajibola, Ene A, NP  doxycycline (VIBRAMYCIN) 100 MG capsule Take 1 capsule (100 mg total) by mouth 2 (two) times daily. One po bid x 7 days 09/12/22   Arthor Captain, PA-C  guaiFENesin (ROBITUSSIN) 100 MG/5ML liquid Take 5 mLs by mouth every 4 (four) hours as needed for cough or to loosen phlegm. 11/15/22   Smitty Knudsen, PA-C  ibuprofen (ADVIL) 400 MG tablet Take 1 tablet (400 mg total) by mouth every 6 (six) hours as needed. 11/15/22   Smitty Knudsen, PA-C  magic mouthwash (lidocaine, diphenhydrAMINE, alum & mag hydroxide) suspension Swish and spit 5 mLs 3 (three) times daily as needed for mouth pain. 10/02/22   Palumbo, April, MD  olanzapine zydis (ZYPREXA) 15 MG disintegrating tablet Take 1 tablet (15 mg total) by mouth  at bedtime. For mood control 02/03/21   Ajibola, Ene A, NP  traZODone (DESYREL) 50 MG tablet Take 1 tablet (50 mg total) by mouth at bedtime. For insomnia Patient not taking: No sig reported 02/03/21 02/03/21  Maricela Bo, NP    Family History No family history on file.  Social History Social History   Tobacco Use   Smoking status: Every Day    Packs/day: 1.00    Years: 10.00    Additional pack years: 0.00    Total pack years: 10.00    Types: Cigarettes   Smokeless tobacco: Never   Tobacco comments:    Patient refused  Vaping Use   Vaping  Use: Never used  Substance Use Topics   Alcohol use: Yes    Comment: twice a week   Drug use: Not Currently    Types: Marijuana    Comment: last use 2021     Allergies   Patient has no known allergies.   Review of Systems Review of Systems  Respiratory:  Positive for cough.   Gastrointestinal:  Positive for diarrhea.     Physical Exam Triage Vital Signs ED Triage Vitals [11/24/22 0914]  Enc Vitals Group     BP (!) 107/38     Pulse Rate 74     Resp 16     Temp 98.4 F (36.9 C)     Temp Source Oral     SpO2 100 %     Weight      Height      Head Circumference      Peak Flow      Pain Score      Pain Loc      Pain Edu?      Excl. in GC?    No data found.  Updated Vital Signs BP (!) 107/38 (BP Location: Left Arm)   Pulse 74   Temp 98.4 F (36.9 C) (Oral)   Resp 16   SpO2 100%   Visual Acuity Right Eye Distance:   Left Eye Distance:   Bilateral Distance:    Right Eye Near:   Left Eye Near:    Bilateral Near:     Physical Exam Constitutional:      Appearance: Normal appearance.  HENT:     Right Ear: Tympanic membrane and ear canal normal.     Left Ear: Tympanic membrane and ear canal normal.     Mouth/Throat:     Mouth: Mucous membranes are moist.     Pharynx: Oropharynx is clear.  Cardiovascular:     Rate and Rhythm: Normal rate and regular rhythm.     Heart sounds: Normal heart sounds.  Pulmonary:     Effort: Pulmonary effort is normal.     Breath sounds: Normal breath sounds and air entry. No wheezing, rhonchi or rales.  Lymphadenopathy:     Cervical: No cervical adenopathy.  Neurological:     Mental Status: He is alert.      UC Treatments / Results  Labs (all labs ordered are listed, but only abnormal results are displayed) Labs Reviewed - No data to display  EKG   Radiology DG Chest 2 View  Result Date: 11/24/2022 CLINICAL DATA:  Cough EXAM: CHEST - 2 VIEW COMPARISON:  11/22/2022 FINDINGS: Artifact from EKG pads. There is no  edema, consolidation, effusion, or pneumothorax. Normal heart size and mediastinal contours. IMPRESSION: Negative for pneumonia. Electronically Signed   By: Tiburcio Pea M.D.   On: 11/24/2022 06:46  Procedures Procedures (including critical care time)  Medications Ordered in UC Medications - No data to display  Initial Impression / Assessment and Plan / UC Course  I have reviewed the triage vital signs and the nursing notes.  Pertinent labs & imaging results that were available during my care of the patient were reviewed by me and considered in my medical decision making (see chart for details).    Plan: The diagnosis will be treated with the following: 1.  Upper respiratory tract infection: A.  Bactrim DS, 1 every 12 hours to treat infection. 2.  Acute cough: A.  Advised to use OTC cough preparations to control cough and congestion. 3.  Referral of patient: A.  Internal referral has been initiated for: [facility] infectious disease [facility]. 4.  Muscular pain: A.  Naprosyn 375 mg every 12 hours with food to help relieve pain and discomfort. 5.  Advised follow-up PCP return to urgent care as needed. Final Clinical Impressions(s) / UC Diagnoses   Final diagnoses:  Viral upper respiratory tract infection  Acute cough  Referral of patient     Discharge Instructions      Advised start the Bactrim DS, 1 every 12 hours until completed to treat infection. Advised take Naprosyn 375 mg every 12 hours with food to help decrease pain and discomfort.  Internal referral has been made to [facility] for Infectious Disease - [place].  [phone].  Their office should be calling you within the next 48 hours to arrange an appointment to be evaluated.  Advised to return to urgent care as needed.    ED Prescriptions     Medication Sig Dispense Auth. Provider   sulfamethoxazole-trimethoprim (BACTRIM DS) 800-160 MG tablet Take 1 tablet by mouth 2 (two) times daily for 7 days.  14 tablet Ellsworth Lennox, PA-C   naproxen (NAPROSYN) 375 MG tablet Take 1 tablet (375 mg total) by mouth 2 (two) times daily. 20 tablet Ellsworth Lennox, PA-C      PDMP not reviewed this encounter.   Ellsworth Lennox, PA-C 11/24/22 1039

## 2022-11-24 NOTE — Discharge Instructions (Signed)
Advised start the Bactrim DS, 1 every 12 hours until completed to treat infection. Advised take Naprosyn 375 mg every 12 hours with food to help decrease pain and discomfort.  Internal referral has been made to [facility] for Infectious Disease - [place].  [phone].  Their office should be calling you within the next 48 hours to arrange an appointment to be evaluated.  Advised to return to urgent care as needed.

## 2022-11-24 NOTE — ED Provider Notes (Signed)
[place] EMERGENCY DEPARTMENT AT [facility] Provider Note   CSN: [id number] Arrival date & time: 11/24/22  0533     History  Chief Complaint  Patient presents with   Shortness of Breath   Chest Pain    Benjamin Simpson is a 32 y.o. male.  HPI   Patient with medical history including schizophrenia, homelessness presenting with complaints of chest pain racing heart.  On my examination patient was not responding to voice or light touch, sternal rub was performed and patient woke up.  Patient was a difficult historian.  he states that he is here because he has pneumonia, states that he has had a cough congestion, fevers and chills.  He is not endorsing any worsening chest pain.  States that this is typically how he feels when he has pneumonia, and he is holding his, the hospital and he has it.  Patient states that he does not want any lab work performed and that he needs to be admitted to the hospital for pneumonia.  Reviewed patient's chart has been seen multiple times for variety of different reasons, was most recently seen 2 days ago for right knee pain and was discharged home.  Home Medications Prior to Admission medications   Medication Sig Start Date End Date Taking? Authorizing Provider  benzonatate (TESSALON) 100 MG capsule Take 1 capsule (100 mg total) by mouth every 8 (eight) hours. 09/12/22   Harris, Cammy Copa, PA-C  bictegravir-emtricitabine-tenofovir AF (BIKTARVY) 50-200-25 MG TABS tablet Take 1 tablet by mouth daily. For HIV information 02/03/21   Ajibola, Ene A, NP  doxycycline (VIBRAMYCIN) 100 MG capsule Take 1 capsule (100 mg total) by mouth 2 (two) times daily. One po bid x 7 days 09/12/22   Arthor Captain, PA-C  guaiFENesin (ROBITUSSIN) 100 MG/5ML liquid Take 5 mLs by mouth every 4 (four) hours as needed for cough or to loosen phlegm. 11/15/22   Smitty Knudsen, PA-C  ibuprofen (ADVIL) 400 MG tablet Take 1 tablet (400 mg total) by mouth every 6 (six) hours as  needed. 11/15/22   Smitty Knudsen, PA-C  magic mouthwash (lidocaine, diphenhydrAMINE, alum & mag hydroxide) suspension Swish and spit 5 mLs 3 (three) times daily as needed for mouth pain. 10/02/22   Palumbo, April, MD  olanzapine zydis (ZYPREXA) 15 MG disintegrating tablet Take 1 tablet (15 mg total) by mouth at bedtime. For mood control 02/03/21   Ajibola, Ene A, NP  traZODone (DESYREL) 50 MG tablet Take 1 tablet (50 mg total) by mouth at bedtime. For insomnia Patient not taking: No sig reported 02/03/21 02/03/21  Maricela Bo, NP      Allergies    Patient has no known allergies.    Review of Systems   Review of Systems  Constitutional:  Positive for chills and fever.  Respiratory:  Positive for cough. Negative for shortness of breath.   Cardiovascular:  Negative for chest pain.  Gastrointestinal:  Negative for abdominal pain.  Neurological:  Negative for headaches.    Physical Exam Updated Vital Signs BP 109/65   Pulse 75   Temp (!) 97.5 F (36.4 C)   Resp 16   SpO2 100%  Physical Exam Vitals and nursing note reviewed.  Constitutional:      General: He is not in acute distress.    Appearance: He is not ill-appearing.  HENT:     Head: Normocephalic and atraumatic.     Nose: No congestion.  Eyes:     Conjunctiva/sclera: Conjunctivae  normal.  Cardiovascular:     Rate and Rhythm: Normal rate and regular rhythm.     Pulses: Normal pulses.     Heart sounds: No murmur heard.    No friction rub. No gallop.  Pulmonary:     Effort: No respiratory distress.     Breath sounds: No wheezing, rhonchi or rales.  Skin:    General: Skin is warm and dry.  Neurological:     Mental Status: He is alert.  Psychiatric:        Mood and Affect: Mood normal.     ED Results / Procedures / Treatments   Labs (all labs ordered are listed, but only abnormal results are displayed) Labs Reviewed - No data to display  EKG None  Radiology DG Chest 2 View  Result Date: 11/24/2022 CLINICAL  DATA:  Cough EXAM: CHEST - 2 VIEW COMPARISON:  11/22/2022 FINDINGS: Artifact from EKG pads. There is no edema, consolidation, effusion, or pneumothorax. Normal heart size and mediastinal contours. IMPRESSION: Negative for pneumonia. Electronically Signed   By: Tiburcio Pea M.D.   On: 11/24/2022 06:46    Procedures Procedures    Medications Ordered in ED Medications - No data to display  ED Course/ Medical Decision Making/ A&P                             Medical Decision Making Amount and/or Complexity of Data Reviewed Radiology: ordered.   This patient presents to the ED for concern of URI, this involves an extensive number of treatment options, and is a complaint that carries with it a high risk of complications and morbidity.  The differential diagnosis includes pneumonia, ACS, PE    Additional history obtained:  Additional history obtained from N/A External records from outside source obtained and reviewed including recent ED notes   Co morbidities that complicate the patient evaluation  Psychiatric disorder  Social Determinants of Health:  Homeless    Lab Tests:  I Ordered, and personally interpreted labs.  The pertinent results include: Patient is refusing   Imaging Studies ordered:  I ordered imaging studies including chest x-ray I independently visualized and interpreted imaging which showed pending I agree with the radiologist interpretation   Cardiac Monitoring:  The patient was maintained on a cardiac monitor.  I personally viewed and interpreted the cardiac monitored which showed an underlying rhythm of: Without signs of ischemia   Medicines ordered and prescription drug management:  I ordered medication including N/A I have reviewed the patients home medicines and have made adjustments as needed  Critical Interventions:  N/A   Reevaluation:  Presents with concerns of pneumonia, patient is hostile, as he is upset due to a sternal rub, I  explained to him this was performed  because he was not responsive on initial evaluation to voice, or light touch explained that this is necessary to ensure that there is not a life-threatening abnormality. patient then states  that his insurance does not cover any lab work, and that he is only agreeable to imaging at this time.  Reassessed the patient, still refusing all lab work, will discharge the patient  Consultations Obtained:  N/A   Test Considered:  N/A   Rule out I have low suspicion for ACS as history is atypical, patient has no cardiac history, EKG was sinus rhythm without signs of ischemia. Low suspicion for PE as patient denies pleuritic chest pain, shortness of breath, patient was [facility]  negative.  Low suspicion for AAA or aortic dissection as history is atypical, patient has low risk factors.  Low suspicion for systemic infection as patient is nontoxic-appearing, vital signs reassuring, no obvious source infection noted on exam.     Dispostion and problem list  After consideration of the diagnostic results and the patients response to treatment, I feel that the patent would benefit from discharge.  Cough-likely viral in nature, will recommend symptom management, follow-up with PCP for further evaluation.    .        Final Clinical Impression(s) / ED Diagnoses Final diagnoses:  Acute cough    Rx / DC Orders ED Discharge Orders     None         Carroll Sage, PA-C 11/24/22 0650    Nira Conn, MD 11/24/22 [phone]

## 2023-03-01 ENCOUNTER — Emergency Department (HOSPITAL_COMMUNITY)
Admission: EM | Admit: 2023-03-01 | Discharge: 2023-03-02 | Disposition: A | Discharge status: Home / Self Care | Payer: MEDICAID | Attending: Student | Admitting: Student

## 2023-03-01 ENCOUNTER — Other Ambulatory Visit: Payer: Self-pay

## 2023-03-01 ENCOUNTER — Emergency Department (HOSPITAL_COMMUNITY): Payer: MEDICAID

## 2023-03-01 ENCOUNTER — Encounter (HOSPITAL_COMMUNITY): Payer: Self-pay | Admitting: Emergency Medicine

## 2023-03-01 DIAGNOSIS — Z21 Asymptomatic human immunodeficiency virus [HIV] infection status: Secondary | ICD-10-CM | POA: Diagnosis not present

## 2023-03-01 DIAGNOSIS — R0781 Pleurodynia: Secondary | ICD-10-CM | POA: Insufficient documentation

## 2023-03-01 DIAGNOSIS — F17210 Nicotine dependence, cigarettes, uncomplicated: Secondary | ICD-10-CM | POA: Diagnosis not present

## 2023-03-01 DIAGNOSIS — M791 Myalgia, unspecified site: Secondary | ICD-10-CM | POA: Diagnosis not present

## 2023-03-01 DIAGNOSIS — S0632AA Contusion and laceration of left cerebrum with loss of consciousness status unknown, initial encounter: Secondary | ICD-10-CM

## 2023-03-01 DIAGNOSIS — S2231XA Fracture of one rib, right side, initial encounter for closed fracture: Secondary | ICD-10-CM | POA: Insufficient documentation

## 2023-03-01 DIAGNOSIS — Z760 Encounter for issue of repeat prescription: Secondary | ICD-10-CM | POA: Insufficient documentation

## 2023-03-01 DIAGNOSIS — Z7689 Persons encountering health services in other specified circumstances: Secondary | ICD-10-CM | POA: Diagnosis not present

## 2023-03-01 DIAGNOSIS — S0990XA Unspecified injury of head, initial encounter: Secondary | ICD-10-CM | POA: Diagnosis not present

## 2023-03-01 DIAGNOSIS — M25561 Pain in right knee: Secondary | ICD-10-CM | POA: Insufficient documentation

## 2023-03-01 DIAGNOSIS — S01511A Laceration without foreign body of lip, initial encounter: Secondary | ICD-10-CM | POA: Insufficient documentation

## 2023-03-01 MED ORDER — KETOROLAC TROMETHAMINE 15 MG/ML IJ SOLN
15.0000 mg | Freq: Once | INTRAMUSCULAR | Status: DC
  Filled 2023-03-01: qty 1

## 2023-03-01 MED ORDER — LIDOCAINE 5 % EX PTCH
1.0000 | MEDICATED_PATCH | CUTANEOUS | Status: DC
  Administered 2023-03-01: 1 via TRANSDERMAL
  Filled 2023-03-01: qty 1

## 2023-03-01 MED ORDER — TRAMADOL HCL 50 MG PO TABS
50.0000 mg | ORAL_TABLET | Freq: Once | ORAL | Status: AC
  Administered 2023-03-01: 50 mg via ORAL
  Filled 2023-03-01: qty 1

## 2023-03-01 NOTE — Discharge Instructions (Signed)
[facility] Monday - Friday 8am - 3pm          Sat & Sun 8am - 2pm [address] [place], [place] [address]   [phone]     www[email or web address] [facility] offers among other critical resources: showers, laundry, barbershop, phone bank, mailroom, computer lab, medical clinic, gardens and a bike maintenance area.   AREA SHELTERS  [facility]  (Men & women) [address] [place] [phone]  [facility] (Men/women/families) [address] [place] [phone]   [facility] (Families with children) [address]  [place] [phone]   [facility] (Domestic Violence Shelter) [address]  [place] [phone]   [facility] (Children ages 7-17) [address]  [place] [phone]   [facility]   (Women & children) [address] [place] [phone]   [facility] (Women/substance abuse) [address]  [place] [phone]   [facility] (Men) [address]  [place] [phone]   [facility] (Men) [address]  [place] [phone]  [facility] (Women) [address]  [place] [phone]   [facility] (Single women & women with children) [address]  [place] [phone]  [facility] (Men/women/families) [address]  [place] [phone]    [facility]   (Domestic Violence shelter) [address]  [place] [phone]   [facility] (Men & women) [address]  [place] [phone]  [facility] (Men) [address]  [place] [phone]   [facility] (Men) [address]  [place] [phone]   [facility] (Single women & families) [address]  [place] [phone]  [facility] (Men/women & families) [address]   [place] [phone]    If you are at risk of losing your housing (throughout [place]) call the Housing Hotline at (336)691-9521. You may also contact 2-1-1, a FREE service of the [employer] that provides information about many resources including housing. Dial 211, or visit online at www.nc211.org. [place]:  [facility], contact Steve Adams [phone] (men only)                          [facility] in [place]:  90 day homeless program for women and men;                             contact Rev. Chambers [phone]  [place]:  [facility]:  men/women/children [phone]  [place]:  Shelter in [place], Pastor Kivett [phone]                       [facility] in [place], Santiago Lopez [phone]   [place]:  [facility], 910-572-3749; (women and children)  [place]:  [facility], men/women/children; [phone]                           [facility] in [place], [phone]; substance abuse halfway house for men              [facility]; 4 bedroom house in [place] for homeless women, contact Elaine Owens [phone]               [facility]   in [place], Susan Bellow, [phone] (women and children)               [facility], for abused women and children, 24 hour crisis line, 910-947-3333, Anne Friesen  [facility], halfway house for women, [place], [phone]  [place]:  [facility], [phone]; open Mon-Thurs from Jan14 - March 15 when temp is below 32 degrees                              [facility]; Pastor Jeff Looney, [phone]; cell [phone]; open 24/7  [place]:  [facility] - Rev Taylor - [phone]  [place]:  transitional housing for women and children; Sabrina Hough [phone]  
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
## 2023-03-01 NOTE — Care Management (Signed)
Consult noted for crisis counseling.  This is not something we provide.  Discussed with provider . Patient is homeless and feels Benjamin Simpson is dangerous. Homeless resources attached to [facility].

## 2023-03-02 ENCOUNTER — Encounter (HOSPITAL_COMMUNITY): Payer: Self-pay

## 2023-03-02 ENCOUNTER — Emergency Department (HOSPITAL_COMMUNITY)
Admission: EM | Admit: 2023-03-02 | Discharge: 2023-03-02 | Disposition: A | Discharge status: Home / Self Care | Payer: MEDICAID | Source: Home / Self Care | Attending: Emergency Medicine | Admitting: Emergency Medicine

## 2023-03-02 DIAGNOSIS — F17210 Nicotine dependence, cigarettes, uncomplicated: Secondary | ICD-10-CM | POA: Insufficient documentation

## 2023-03-02 DIAGNOSIS — Z21 Asymptomatic human immunodeficiency virus [HIV] infection status: Secondary | ICD-10-CM | POA: Insufficient documentation

## 2023-03-02 DIAGNOSIS — M25561 Pain in right knee: Secondary | ICD-10-CM

## 2023-03-02 DIAGNOSIS — Z7689 Persons encountering health services in other specified circumstances: Secondary | ICD-10-CM

## 2023-03-02 DIAGNOSIS — Z760 Encounter for issue of repeat prescription: Secondary | ICD-10-CM | POA: Insufficient documentation

## 2023-03-02 DIAGNOSIS — R0781 Pleurodynia: Secondary | ICD-10-CM

## 2023-03-02 MED ORDER — TRAMADOL HCL 50 MG PO TABS: 50.0000 mg | ORAL_TABLET | Freq: Once | ORAL | Status: DC

## 2023-03-02 MED ORDER — LIDOCAINE 5 % EX PTCH: 1.0000 | MEDICATED_PATCH | CUTANEOUS | 0 refills | Status: DC

## 2023-03-02 MED ORDER — NAPROXEN 375 MG PO TABS: 375.0000 mg | ORAL_TABLET | Freq: Two times a day (BID) | ORAL | 0 refills | Status: DC

## 2023-03-02 MED ORDER — TRAMADOL HCL 50 MG PO TABS: 50.0000 mg | ORAL_TABLET | Freq: Two times a day (BID) | ORAL | 0 refills | Status: DC | PRN
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
## 2023-03-02 NOTE — ED Notes (Signed)
Patient was sent to the waiting room to speak with social worker in the morning .
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
## 2023-03-02 NOTE — ED Notes (Signed)
Patient ambulated to the nurses station. Patient declined food and beverage.
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
## 2023-03-02 NOTE — Discharge Instructions (Addendum)
As discussed, I have placed a order for social work to help coordinate primary care for you in the outpatient setting.  Attached is number for you to call to set up a primary care within the [facility] system.  Recommend continue use of naproxen at home for baseline pain with tramadol for breakthrough pain.  Keep follow-up appointments as directed by prior provider yesterday.  Please do not hesitate to return to emergency department if the worrisome signs and symptoms we discussed become apparent.

## 2023-03-05 ENCOUNTER — Emergency Department (HOSPITAL_COMMUNITY)
Admission: EM | Admit: 2023-03-05 | Discharge: 2023-03-05 | Disposition: A | Discharge status: Home / Self Care | Payer: MEDICAID | Attending: Student | Admitting: Student

## 2023-03-05 ENCOUNTER — Emergency Department (HOSPITAL_COMMUNITY): Payer: MEDICAID

## 2023-03-05 ENCOUNTER — Encounter (HOSPITAL_COMMUNITY): Payer: Self-pay | Admitting: *Deleted

## 2023-03-05 ENCOUNTER — Other Ambulatory Visit: Payer: Self-pay

## 2023-03-05 ENCOUNTER — Emergency Department (HOSPITAL_COMMUNITY)
Admission: EM | Admit: 2023-03-05 | Discharge: 2023-03-05 | Disposition: A | Discharge status: Home / Self Care | Payer: MEDICAID | Source: Home / Self Care | Attending: Emergency Medicine | Admitting: Emergency Medicine

## 2023-03-05 DIAGNOSIS — W108XXA Fall (on) (from) other stairs and steps, initial encounter: Secondary | ICD-10-CM | POA: Insufficient documentation

## 2023-03-05 DIAGNOSIS — Z21 Asymptomatic human immunodeficiency virus [HIV] infection status: Secondary | ICD-10-CM | POA: Diagnosis not present

## 2023-03-05 DIAGNOSIS — Z5900 Homelessness unspecified: Secondary | ICD-10-CM | POA: Insufficient documentation

## 2023-03-05 DIAGNOSIS — W19XXXA Unspecified fall, initial encounter: Secondary | ICD-10-CM

## 2023-03-05 DIAGNOSIS — F17210 Nicotine dependence, cigarettes, uncomplicated: Secondary | ICD-10-CM | POA: Diagnosis not present

## 2023-03-05 DIAGNOSIS — R0781 Pleurodynia: Secondary | ICD-10-CM | POA: Diagnosis not present

## 2023-03-05 DIAGNOSIS — M25561 Pain in right knee: Secondary | ICD-10-CM | POA: Insufficient documentation

## 2023-03-05 MED ORDER — DICLOFENAC SODIUM 1 % EX GEL: 4.0000 g | Freq: Four times a day (QID) | CUTANEOUS | 0 refills | Status: DC

## 2023-03-05 MED ORDER — HYDROCODONE-ACETAMINOPHEN 5-325 MG PO TABS
1.0000 | ORAL_TABLET | Freq: Once | ORAL | Status: AC
  Administered 2023-03-05: 1 via ORAL
  Filled 2023-03-05: qty 1

## 2023-03-05 MED ORDER — HYDROCODONE-ACETAMINOPHEN 5-325 MG PO TABS: 2.0000 | ORAL_TABLET | ORAL | 0 refills | Status: DC | PRN

## 2023-03-05 MED ORDER — KETOROLAC TROMETHAMINE 15 MG/ML IJ SOLN
15.0000 mg | Freq: Once | INTRAMUSCULAR | Status: AC
  Administered 2023-03-05: 15 mg via INTRAMUSCULAR
  Filled 2023-03-05: qty 1

## 2023-03-05 MED ORDER — LIDOCAINE 5 % EX PTCH
1.0000 | MEDICATED_PATCH | CUTANEOUS | Status: DC
  Administered 2023-03-05: 1 via TRANSDERMAL
  Filled 2023-03-05: qty 1
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
## 2023-03-05 NOTE — Discharge Instructions (Signed)
Follow-up with your doctor about your knee.
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
## 2023-03-05 NOTE — ED Notes (Signed)
Pt transported to xray 

## 2023-03-05 NOTE — ED Triage Notes (Signed)
[facility] pt stated he fell down stairs and is c/o right rib [pain. Pt stated he was at hospital recently with same pain in ribs.  Bp -114/76 Hr-96 98%  16rr

## 2023-03-05 NOTE — Discharge Instructions (Addendum)
CT Chest: FINDINGS: Cardiovascular: No significant vascular findings. Normal heart size. No pericardial effusion.   Mediastinum/Nodes: No enlarged mediastinal or axillary lymph nodes. Thyroid gland, trachea, and esophagus demonstrate no significant findings.   Lungs/Pleura: Lungs are clear. No pleural effusion or pneumothorax.   Upper Abdomen: No acute abnormality.   Musculoskeletal: There is an acute fracture of the right seventh rib.   IMPRESSION: Acute fracture of the right seventh rib.

## 2023-03-16 ENCOUNTER — Encounter (HOSPITAL_COMMUNITY): Payer: Self-pay | Admitting: Behavioral Health

## 2023-03-16 ENCOUNTER — Ambulatory Visit (HOSPITAL_COMMUNITY)
Admission: EM | Admit: 2023-03-16 | Discharge: 2023-03-17 | Disposition: A | Discharge status: Home / Self Care | Payer: MEDICAID | Attending: Behavioral Health | Admitting: Behavioral Health

## 2023-03-16 DIAGNOSIS — Z5900 Homelessness unspecified: Secondary | ICD-10-CM | POA: Diagnosis not present

## 2023-03-16 DIAGNOSIS — F17210 Nicotine dependence, cigarettes, uncomplicated: Secondary | ICD-10-CM | POA: Diagnosis not present

## 2023-03-16 DIAGNOSIS — F200 Paranoid schizophrenia: Secondary | ICD-10-CM | POA: Diagnosis not present

## 2023-03-16 LAB — POCT URINE DRUG SCREEN - MANUAL ENTRY (I-SCREEN)
POC Amphetamine UR: NOT DETECTED
POC Buprenorphine (BUP): NOT DETECTED
POC Cocaine UR: NOT DETECTED
POC Marijuana UR: POSITIVE — AB
POC Methadone UR: NOT DETECTED
POC Methamphetamine UR: NOT DETECTED
POC Morphine: POSITIVE — AB
POC Oxazepam (BZO): NOT DETECTED
POC Oxycodone UR: NOT DETECTED
POC Secobarbital (BAR): NOT DETECTED

## 2023-03-16 MED ORDER — ALUM & MAG HYDROXIDE-SIMETH 200-200-20 MG/5ML PO SUSP: 30.0000 mL | ORAL | Status: DC | PRN

## 2023-03-16 MED ORDER — MAGNESIUM HYDROXIDE 400 MG/5ML PO SUSP: 30.0000 mL | Freq: Every day | ORAL | Status: DC | PRN

## 2023-03-16 MED ORDER — OLANZAPINE 5 MG PO TBDP
5.0000 mg | ORAL_TABLET | Freq: Every day | ORAL | Status: DC
  Administered 2023-03-16: 5 mg via ORAL
  Filled 2023-03-16: qty 1

## 2023-03-16 MED ORDER — ACETAMINOPHEN 325 MG PO TABS: 650.0000 mg | ORAL_TABLET | Freq: Four times a day (QID) | ORAL | Status: DC | PRN

## 2023-03-16 MED ORDER — BICTEGRAVIR-EMTRICITAB-TENOFOV 50-200-25 MG PO TABS
1.0000 | ORAL_TABLET | Freq: Every day | ORAL | Status: DC
  Administered 2023-03-16 – 2023-03-17 (×2): 1 via ORAL
  Filled 2023-03-16 (×2): qty 1

## 2023-03-16 MED ORDER — TRAZODONE HCL 50 MG PO TABS
50.0000 mg | ORAL_TABLET | Freq: Every evening | ORAL | Status: DC | PRN
  Administered 2023-03-16: 50 mg via ORAL
  Filled 2023-03-16: qty 1

## 2023-03-16 MED ORDER — HYDROXYZINE HCL 25 MG PO TABS
25.0000 mg | ORAL_TABLET | Freq: Three times a day (TID) | ORAL | Status: DC | PRN
  Administered 2023-03-16: 25 mg via ORAL
  Filled 2023-03-16: qty 1

## 2023-03-16 NOTE — ED Notes (Signed)
Pt A&O x 4, awake & resting at present, no distress noted, calm & cooperative.  Monitoring for safety. 

## 2023-03-16 NOTE — ED Notes (Signed)
Patient refused have blood drawn. Patient states "It's against my religion". Benjamin Oyster, NP made aware.

## 2023-03-16 NOTE — ED Notes (Signed)
Pt is awake and talking with other patients on unit. He is calm and cooperative no c/o pain or distress noted. Will continue to monitor for safety
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
## 2023-03-16 NOTE — ED Provider Notes (Signed)
[facility] Continuous Assessment Admission H&P  Date: 03/16/23 Patient Name: Benjamin Simpson MRN: [id number] Chief Complaint: "I want to be evaluated to prove I'm not bipolar or schizophrenic"  Diagnoses:  Final diagnoses:  Paranoid schizophrenia (HCC)   HPI: Benjamin Simpson is 32 y.o. male patient with a past psychiatric history of paranoid schizophrenia, suicidal ideation, and substance use disorder who presented to [facility] voluntarily and unaccompanied via [employer] requesting a mental health evaluation to prove he does not have any mental health issues. Per [employer], patient was picked up from an apartment complex off [address] sleeping on the premises and they didn't want to press charges. 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   Patient assessed face-to-face by this provider and chart reviewed on 03/16/23. Counselor Beryle Flock present during assessment. On evaluation, [name] is seated in assessment area in no acute distress. Patient is alert and oriented x4, cooperative. Speech is clear and coherent, normal rate and volume. Eye contact is good. Mood is anxious with congruent affect. Thought process is circumstantial with thought content that consists of paranoid ideations and delusions. Patient denies that he has any past psychiatric history. Patient denies suicidal and homicidal ideations and verbally contracts for safety. Patient denies a history of suicide attempts or self-harm. Patient denies past psychiatric hospitalizations. Per chart review, patient was admitted to [facility] in June 2022 and has had 14 ED visits in the past 6 months. Patient denies auditory and visual hallucinations. Patient denies denies symptoms of paranoia. Patient initially states he contacted [employer] himself today because "I want an evaluation for proof of my mental health state, that is an issue for me integrating into [place]."  Patient states he moved to [facility] [place] from [place] in January 2024. Patient states he has been to the [facility] and [facility] but "no one will help because they know my mom. These foundations keep telling me to get mentally evaluated." Patient appears to be preoccupied with obtaining a mental health evaluation today to prove he does not have a mental health condition. Patient appears paranoid and somewhat suspicious as he questions most of what he is told during assessment. Patient then states his mother called the police today on him "because I kept knocking on the door, coming back and forth, nagging and annoying her." Objectively, there is evidence of psychosis and delusional thinking. Patient does not appear to be responding to internal or external stimuli at this time.   Patient reports fair sleep (6 hours/night) and good appetite, however states he has limited access to food. Patient reports his mother kicked him out today and that he is currently homeless. Patient denies access to weapons/firearms. Patient states he works for [employer] but hasn't started yet due to lack of housing and still needs to complete orientation. Patient also reports he has a job at [employer] that he can go back to. Patient states he does not currently take any psychotropic medication and does not have outpatient psychiatric services currently in place for therapy or medication management. Patient reports he started using marijuana at 32 y/o and currently uses is 1-2x every other week, last use 3 days ago. Patient denies use of alcohol or other illicit substances.   Patient gave verbal consent for provider to contact his mother to obtain collateral information. Patient states his mother's name is Sunny Schlein and number is [phone]. Provider attempted to call this number  and reached a voicemail for "Vickie." Provider asked patient if this is his mother's correct number and patient states he does not know but believe his mother's  number may have changed. Per chart review, the number for patient's mother Sunny Schlein [place]) is [phone]. Provider attempted to call this number but it went directly to voicemail. HIPAA compliant voicemail left requesting return call.   Patient offered support and encouragement. Discussed with patient admission to the continuous observation unit overnight with reevaluation in the morning. Discussed with patient continuing his HIV medication Biktarvy and restarting Zyprexa at 5mg  at bedtime. Patient is in agreement with plan of care.  Total Time spent with patient: 45 minutes  Musculoskeletal  Strength & Muscle Tone: within normal limits Gait & Station: normal Patient leans: N/A  Psychiatric Specialty Exam  Presentation General Appearance:  Appropriate for Environment  Eye Contact: Good  Speech: Clear and Coherent; Normal Rate  Speech Volume: Normal  Handedness: Right   Mood and Affect  Mood: Anxious  Affect: Congruent   Thought Process  Thought Processes: Linear  Descriptions of Associations:Circumstantial  Orientation:Full (Time, Place and Person)  Thought Content:Delusions; Paranoid Ideation  Diagnosis of Schizophrenia or Schizoaffective disorder in past: Yes  Duration of Psychotic Symptoms: Greater than six months  Hallucinations:Hallucinations: None  Ideas of Reference:Delusions; Paranoia  Suicidal Thoughts:Suicidal Thoughts: No  Homicidal Thoughts:Homicidal Thoughts: No   Sensorium  Memory: Immediate Fair; Recent Fair; Remote Fair  Judgment: Poor  Insight: Lacking   Executive Functions  Concentration: Fair  Attention Span: Fair  Recall: [employer] of Knowledge: Fair  Language: Fair   Psychomotor Activity  Psychomotor Activity: Psychomotor Activity: Normal   Assets  Assets: Communication Skills; Desire for Improvement; Financial Resources/Insurance; Leisure Time; Physical Health; Resilience   Sleep  Sleep: Sleep:  Fair Number of Hours of Sleep: 6   Nutritional Assessment (For OBS and FBC admissions only) Has the patient had a weight loss or gain of 10 pounds or more in the last 3 months?: No Has the patient had a decrease in food intake/or appetite?: No Does the patient have dental problems?: No Does the patient have eating habits or behaviors that may be indicators of an eating disorder including binging or inducing vomiting?: No Has the patient recently lost weight without trying?: 0 Has the patient been eating poorly because of a decreased appetite?: 0 Malnutrition Screening Tool Score: 0    Physical Exam Vitals and nursing note reviewed.  Constitutional:      General: He is not in acute distress.    Appearance: Normal appearance. He is not ill-appearing.  HENT:     Head: Normocephalic and atraumatic.     Nose: Nose normal.  Eyes:     General:        Right eye: No discharge.        Left eye: No discharge.     Conjunctiva/sclera: Conjunctivae normal.  Cardiovascular:     Rate and Rhythm: Normal rate.  Pulmonary:     Effort: Pulmonary effort is normal. No respiratory distress.  Musculoskeletal:        General: Normal range of motion.     Cervical back: Normal range of motion.  Skin:    General: Skin is warm and dry.  Neurological:     General: No focal deficit present.     Mental Status: He is alert and oriented to person, place, and time. Mental status is at baseline.  Psychiatric:        Attention and  Perception: Attention and perception normal.        Mood and Affect: Mood is anxious.        Speech: Speech normal.        Behavior: Behavior is cooperative.        Thought Content: Thought content is paranoid and delusional. Thought content does not include homicidal or suicidal ideation. Thought content does not include homicidal or suicidal plan.        Cognition and Memory: Cognition and memory normal.     Comments: Affect: Congruent Judgment: Poor    Review of Systems   Constitutional: Negative.   HENT: Negative.    Eyes: Negative.   Respiratory: Negative.    Cardiovascular: Negative.   Gastrointestinal: Negative.   Genitourinary: Negative.   Musculoskeletal: Negative.   Skin: Negative.   Neurological: Negative.   Endo/Heme/Allergies: Negative.   Psychiatric/Behavioral:  Negative for depression, hallucinations, memory loss, substance abuse and suicidal ideas. The patient is nervous/anxious. The patient does not have insomnia.        Paranoia, delusions    Blood pressure 118/71, pulse 69, temperature 98.5 F (36.9 C), temperature source Oral, resp. rate 16, SpO2 100%. There is no height or weight on file to calculate BMI.  Past Psychiatric History: Paranoid schizophrenia, suicidal ideation, substance use disorder  Is the patient at risk to self?  Patient denies Has the patient been a risk to self in the past 6 months?  Patient denies .    Has the patient been a risk to self within the distant past?  Patient denies   Is the patient a risk to others?  Patient denies   Has the patient been a risk to others in the past 6 months?  Patient denies   Has the patient been a risk to others within the distant past?  Patient denies  Past Medical History:  Past Medical History:  Diagnosis Date   HIV (human immunodeficiency virus infection) (HCC)    Family History: None reported  Social History:  Social History   Tobacco Use   Smoking status: Every Day    Current packs/day: 1.00    Average packs/day: 1 pack/day for 10.0 years (10.0 ttl pk-yrs)    Types: Cigarettes   Smokeless tobacco: Never   Tobacco comments:    Patient refused  Vaping Use   Vaping status: Never Used  Substance Use Topics   Alcohol use: Yes    Comment: twice a week   Drug use: Not Currently    Types: Marijuana    Comment: last use 2021   Last Labs:  Admission on 03/16/2023  Component Date Value Ref Range Status   POC Amphetamine UR 03/16/2023 None Detected  NONE DETECTED  (Cut Off Level 1000 ng/mL) Final   POC Secobarbital (BAR) 03/16/2023 None Detected  NONE DETECTED (Cut Off Level 300 ng/mL) Final   POC Buprenorphine (BUP) 03/16/2023 None Detected  NONE DETECTED (Cut Off Level 10 ng/mL) Final   POC Oxazepam (BZO) 03/16/2023 None Detected  NONE DETECTED (Cut Off Level 300 ng/mL) Final   POC Cocaine UR 03/16/2023 None Detected  NONE DETECTED (Cut Off Level 300 ng/mL) Final   POC Methamphetamine UR 03/16/2023 None Detected  NONE DETECTED (Cut Off Level 1000 ng/mL) Final   POC Morphine 03/16/2023 Positive (A)  NONE DETECTED (Cut Off Level 300 ng/mL) Final   POC Methadone UR 03/16/2023 None Detected  NONE DETECTED (Cut Off Level 300 ng/mL) Final   POC Oxycodone UR 03/16/2023 None Detected  NONE DETECTED (Cut Off Level 100 ng/mL) Final   POC Marijuana UR 03/16/2023 Positive (A)  NONE DETECTED (Cut Off Level 50 ng/mL) Final  Admission on 11/15/2022, Discharged on 11/15/2022  Component Date Value Ref Range Status   SARS Coronavirus 2 by RT PCR 11/15/2022 NEGATIVE  NEGATIVE Final   Influenza A by PCR 11/15/2022 NEGATIVE  NEGATIVE Final   Influenza B by PCR 11/15/2022 NEGATIVE  NEGATIVE Final   Comment: (NOTE) The Xpert Xpress SARS-CoV-2/FLU/RSV plus assay is intended as an aid in the diagnosis of influenza from Nasopharyngeal swab specimens and should not be used as a sole basis for treatment. Nasal washings and aspirates are unacceptable for Xpert Xpress SARS-CoV-2/FLU/RSV testing.  Fact Sheet for Patients: BloggerCourse.com  Fact Sheet for Healthcare Providers: SeriousBroker.it  This test is not yet approved or cleared by the [place] FDA and has been authorized for detection and/or diagnosis of SARS-CoV-2 by FDA under an Emergency Use Authorization (EUA). This EUA will remain in effect (meaning this test can be used) for the duration of the COVID-19 declaration under Section 564(b)(1) of the Act, 21  U.S.C. section 360bbb-3(b)(1), unless the authorization is terminated or revoked.     Resp Syncytial Virus by PCR 11/15/2022 NEGATIVE  NEGATIVE Final   Comment: (NOTE) Fact Sheet for Patients: BloggerCourse.com  Fact Sheet for Healthcare Providers: SeriousBroker.it  This test is not yet approved or cleared by the [place] FDA and has been authorized for detection and/or diagnosis of SARS-CoV-2 by FDA under an Emergency Use Authorization (EUA). This EUA will remain in effect (meaning this test can be used) for the duration of the COVID-19 declaration under Section 564(b)(1) of the Act, 21 U.S.C. section 360bbb-3(b)(1), unless the authorization is terminated or revoked.  Performed at [facility], [address], [place], [place] [address]   Admission on 10/02/2022, Discharged on 10/02/2022  Component Date Value Ref Range Status   SARS Coronavirus 2 by RT PCR 10/02/2022 NEGATIVE  NEGATIVE Final   Comment: (NOTE) SARS-CoV-2 target nucleic acids are NOT DETECTED.  The SARS-CoV-2 RNA is generally detectable in upper respiratory specimens during the acute phase of infection. The lowest concentration of SARS-CoV-2 viral copies this assay can detect is 138 copies/mL. A negative result does not preclude SARS-Cov-2 infection and should not be used as the sole basis for treatment or other patient management decisions. A negative result may occur with  improper specimen collection/handling, submission of specimen other than nasopharyngeal swab, presence of viral mutation(s) within the areas targeted by this assay, and inadequate number of viral copies(<138 copies/mL). A negative result must be combined with clinical observations, patient history, and epidemiological information. The expected result is Negative.  Fact Sheet for Patients:  BloggerCourse.com  Fact Sheet for Healthcare Providers:   SeriousBroker.it  This test is no                          t yet approved or cleared by the [place] FDA and  has been authorized for detection and/or diagnosis of SARS-CoV-2 by FDA under an Emergency Use Authorization (EUA). This EUA will remain  in effect (meaning this test can be used) for the duration of the COVID-19 declaration under Section 564(b)(1) of the Act, 21 U.S.C.section 360bbb-3(b)(1), unless the authorization is terminated  or revoked sooner.       Influenza A by PCR 10/02/2022 NEGATIVE  NEGATIVE Final   Influenza B by PCR 10/02/2022 NEGATIVE  NEGATIVE  Final   Comment: (NOTE) The Xpert Xpress SARS-CoV-2/FLU/RSV plus assay is intended as an aid in the diagnosis of influenza from Nasopharyngeal swab specimens and should not be used as a sole basis for treatment. Nasal washings and aspirates are unacceptable for Xpert Xpress SARS-CoV-2/FLU/RSV testing.  Fact Sheet for Patients: BloggerCourse.com  Fact Sheet for Healthcare Providers: SeriousBroker.it  This test is not yet approved or cleared by the [place] FDA and has been authorized for detection and/or diagnosis of SARS-CoV-2 by FDA under an Emergency Use Authorization (EUA). This EUA will remain in effect (meaning this test can be used) for the duration of the COVID-19 declaration under Section 564(b)(1) of the Act, 21 U.S.C. section 360bbb-3(b)(1), unless the authorization is terminated or revoked.     Resp Syncytial Virus by PCR 10/02/2022 NEGATIVE  NEGATIVE Final   Comment: (NOTE) Fact Sheet for Patients: BloggerCourse.com  Fact Sheet for Healthcare Providers: SeriousBroker.it  This test is not yet approved or cleared by the [place] FDA and has been authorized for detection and/or diagnosis of SARS-CoV-2 by FDA under an Emergency Use Authorization (EUA). This EUA  will remain in effect (meaning this test can be used) for the duration of the COVID-19 declaration under Section 564(b)(1) of the Act, 21 U.S.C. section 360bbb-3(b)(1), unless the authorization is terminated or revoked.  Performed at [facility], [address] [place], [place] [address]   Admission on 09/21/2022, Discharged on 09/21/2022  Component Date Value Ref Range Status   SARS Coronavirus 2 by RT PCR 09/21/2022 NEGATIVE  NEGATIVE Final   Influenza A by PCR 09/21/2022 NEGATIVE  NEGATIVE Final   Influenza B by PCR 09/21/2022 NEGATIVE  NEGATIVE Final   Comment: (NOTE) The Xpert Xpress SARS-CoV-2/FLU/RSV plus assay is intended as an aid in the diagnosis of influenza from Nasopharyngeal swab specimens and should not be used as a sole basis for treatment. Nasal washings and aspirates are unacceptable for Xpert Xpress SARS-CoV-2/FLU/RSV testing.  Fact Sheet for Patients: BloggerCourse.com  Fact Sheet for Healthcare Providers: SeriousBroker.it  This test is not yet approved or cleared by the [place] FDA and has been authorized for detection and/or diagnosis of SARS-CoV-2 by FDA under an Emergency Use Authorization (EUA). This EUA will remain in effect (meaning this test can be used) for the duration of the COVID-19 declaration under Section 564(b)(1) of the Act, 21 U.S.C. section 360bbb-3(b)(1), unless the authorization is terminated or revoked.     Resp Syncytial Virus by PCR 09/21/2022 NEGATIVE  NEGATIVE Final   Comment: (NOTE) Fact Sheet for Patients: BloggerCourse.com  Fact Sheet for Healthcare Providers: SeriousBroker.it  This test is not yet approved or cleared by the [place] FDA and has been authorized for detection and/or diagnosis of SARS-CoV-2 by FDA under an Emergency Use Authorization (EUA). This EUA will remain in effect (meaning  this test can be used) for the duration of the COVID-19 declaration under Section 564(b)(1) of the Act, 21 U.S.C. section 360bbb-3(b)(1), unless the authorization is terminated or revoked.  Performed at [facility], [address] [place], [place] [address]     Allergies: Patient has no known allergies.  Medications:  Facility Ordered Medications  Medication   acetaminophen (TYLENOL) tablet 650 mg   alum & mag hydroxide-simeth (MAALOX/MYLANTA) 200-200-20 MG/5ML suspension 30 mL   magnesium hydroxide (MILK OF MAGNESIA) suspension 30 mL   hydrOXYzine (ATARAX) tablet 25 mg   traZODone (DESYREL) tablet 50 mg   bictegravir-emtricitabine-tenofovir AF (BIKTARVY) 50-200-25 MG per  tablet 1 tablet   OLANZapine zydis (ZYPREXA) disintegrating tablet 5 mg   PTA Medications  Medication Sig   diclofenac Sodium (VOLTAREN) 1 % GEL Apply 4 g topically 4 (four) times daily.   olanzapine zydis (ZYPREXA) 15 MG disintegrating tablet Take 1 tablet (15 mg total) by mouth at bedtime. For mood control (Patient not taking: Reported on 03/16/2023)   bictegravir-emtricitabine-tenofovir AF (BIKTARVY) 50-200-25 MG TABS tablet Take 1 tablet by mouth daily. For HIV information (Patient not taking: Reported on 03/16/2023)   lidocaine (LIDODERM) 5 % Place 1 patch onto the skin daily. Remove & Discard patch within 12 hours or as directed by MD (Patient not taking: Reported on 03/16/2023)   HYDROcodone-acetaminophen (NORCO/VICODIN) 5-325 MG tablet Take 2 tablets by mouth every 4 (four) hours as needed. (Patient not taking: Reported on 03/16/2023)      Medical Decision Making  Gadiel Deliman [facility] was admitted to [facility] continuous assessment unit for Paranoid schizophrenia [facility] crisis management, and stabilization. Patient remains voluntary.  Routine labs ordered, which include Lab Orders         CBC with Differential/Platelet         Comprehensive metabolic panel          Hemoglobin A1c         Magnesium         Ethanol         Lipid panel         TSH         Prolactin         POCT Urine Drug Screen - (I-Screen)    -EKG Medication Management: Medications started, continue home medications as appropriate Meds ordered this encounter  Medications   acetaminophen (TYLENOL) tablet 650 mg   alum & mag hydroxide-simeth (MAALOX/MYLANTA) 200-200-20 MG/5ML suspension 30 mL   magnesium hydroxide (MILK OF MAGNESIA) suspension 30 mL   hydrOXYzine (ATARAX) tablet 25 mg   traZODone (DESYREL) tablet 50 mg   bictegravir-emtricitabine-tenofovir AF (BIKTARVY) 50-200-25 MG per tablet 1 tablet   OLANZapine zydis (ZYPREXA) disintegrating tablet 5 mg    Will maintain observation checks every 15 minutes for safety. Psychosocial education regarding relapse prevention and self-care; social and communication  Social work will consult with family for collateral information and discuss discharge and follow up plan.   Recommendations  Based on my evaluation the patient does not appear to have an emergency medical condition.  -Restart Zyprexa at 5mg  at bedtime -Recommend admission to the continuous observation unit overnight with reevaluation tomorrow morning (03/17/23) to determine the appropriate level of care  Sunday Corn, NP 03/16/23  3:22 PM

## 2023-03-16 NOTE — ED Notes (Signed)
[address]

## 2023-03-16 NOTE — BH Assessment (Signed)
Comprehensive Clinical Assessment (CCA) Note  03/16/2023 Trillion Renicker [facility] [id number]  DISPOSITION: Per Eliezer Champagne NP pt is recommended for overnight observation in the OBS unit at [facility]   The patient demonstrates the following risk factors for suicide: Chronic risk factors for suicide include: psychiatric disorder of schizophrenia and substance use disorder. Acute risk factors for suicide include: family or marital conflict. Protective factors for this patient include: hope for the future. Considering these factors, the overall suicide risk at this point appears to be low. Patient is appropriate for outpatient follow up.   Per Triage assessment: "Pt arrived to [facility] via [employer]. Pt was picked up from an apartment complex off of [address] Per [employer], the apartment complex called stating that the pt was sleeping on the premises; however, they didn't want to press charges. Pt. now homeless because he was put out of his mother's house on this past Saturday. Pt stated that he just needs an evaluation to prove to his mother that he doesn't have any mental health issues. 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  Pt arrived to [facility] via [employer]. Pt was picked up from an apartment complex off of [address] Per [employer], the apartment complex called stating that the pt was sleeping on the premises; however, they didn't want to press charges. Pt. now homeless because he was put out of his mother's house on this past Saturday. Pt stated that he just needs an evaluation to prove to his mother that he doesn't have any mental health issues. 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 Pt was singularly focused on getting "paperwork" so that he could get services from "foundations" to help him get on his feet. Pt stated he was "trying to re-integrate into [place]" and then stated he has been in [place] since January 2024. He specified he did not want the  paperwork to mention schizophrenia or bipolar d/o. Pt was somewhat suspicious and questioning most of what he was told. Pt told differing stories at times such as his employment. 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 Pt denied having any children today (03/16/23). Earlier, per chart, he stated he has 2 children ages 43 and 74 yo daughters.   Pt stated that he was now homeless because his mother, Sunny Schlein, "put me out again." Pt gave a number for his mother and permission to contact her but the number was not her number. Pt stated that he has been housed at the [facility] and [facility] previously. Pt stated that his mother "banned him from her place." Pt denied SI, HI, NSSH, AVH and paranoia. Pt reported regular cannabis use 1 to 2 times per week. Pt stated he was sleeping about 5-6 hours per night and his appetite was "normal" although he added he was "poor" and had little access to food. Pt denied being a psychiatric hospital and yet, his chart indicates her was inpatient at [facility] in June 2022.    Chief Complaint:  Chief Complaint  Patient presents with   Psychiatric Evaluation   Visit Diagnosis:  Paranoid schizophrenia    CCA Screening, Triage and Referral (STR)  Patient Reported Information How did you hear about [place]? Legal System  What Is the Reason for Your Visit/Call Today? Pt arrived to [facility] via [facility]. Pt was picked up from an apartment complex off of [address] Per [employer],  the apartment complex called stating that the pt was sleeping on the premises; however, they didn't want to press charges. Pt. now homeless because he was put out of his mother's house on this past Saturday. Pt stated that he just needs an evaluation to prove to his mother that he doesn't have any mental health issues. 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  How Long Has This Been Causing You Problems? 1-6 months  What Do You Feel Would Help You the Most Today? Social  Support (evaluation)   Have You Recently Had Any Thoughts About Hurting Yourself? No  Are You Planning to Commit Suicide/Harm Yourself At This time? No   Flowsheet Row ED from 03/16/2023 in [facility] Most recent reading at 03/16/2023  2:13 PM ED from 03/05/2023 in [facility] Emergency Department at [facility] Most recent reading at 03/05/2023  4:15 AM ED from 03/05/2023 in [facility] Emergency Department at [facility] Most recent reading at 03/05/2023  1:39 AM  C-SSRS RISK CATEGORY No Risk No Risk No Risk       Have you Recently Had Thoughts About Hurting Someone Karolee Ohs? No  Are You Planning to Harm Someone at This Time? No  Explanation: na  Have You Used Any Alcohol or Drugs in the Past 24 Hours? No  What Did You Use and How Much? na  Do You Currently Have a Therapist/Psychiatrist? No  Name of Therapist/Psychiatrist: Name of Therapist/Psychiatrist: na   Have You Been Recently Discharged From Any Office Practice or Programs? No  Explanation of Discharge From Practice/Program: na     CCA Screening Triage Referral Assessment Type of Contact: Face-to-Face  Telemedicine Service Delivery:   Is this Initial or Reassessment?   Date Telepsych consult ordered in [facility]:    Time Telepsych consult ordered in [facility]:    Location of Assessment: [facility]  Provider Location: [facility]   Collateral Involvement: NP to call mother, Sunny Schlein, with pt's permission [phone])   Does Patient Have a Court Appointed Legal Guardian? No  Legal Guardian Contact Information: na  Copy of Legal Guardianship Form: No - copy requested  Legal Guardian Notified of Arrival: -- (na)  Legal Guardian Notified of Pending Discharge: -- (na)  If Minor and Not Living with Parent(s), Who has Custody? adult  Is CPS involved or ever been involved? -- (none reported)  Is APS involved or ever been involved? -- (None  reported)   Patient Determined To Be At Risk for Harm To Self or Others Based on Review of Patient Reported Information or Presenting Complaint? No  Method: No Plan  Availability of Means: No access or NA  Intent: Vague intent or NA  Notification Required: No need or identified person  Additional Information for Danger to Others Potential: Active psychosis (symptoms of paranoia)  Additional Comments for Danger to Others Potential: circumstantial thought processes  Are There Guns or Other Weapons in Your Home? No  Types of Guns/Weapons: na  Are These Weapons Safely Secured?                            -- (na)  Who Could Verify You Are Able To Have These Secured: na  Do You Have any Outstanding Charges, Pending Court Dates, Parole/Probation? none reported- denied  Contacted To Inform of Risk of Harm To Self or Others: -- (na)    Does Patient Present under Involuntary Commitment?  No    [place] of Residence: [place]   Patient Currently Receiving the Following Services: Not Receiving Services   Determination of Need: Urgent (48 hours) (Per Eliezer Champagne NP pt is recommended fot overnight observation in the OBS unit at [facility])   Options For Referral: [facility] (OBS)     CCA Biopsychosocial Patient Reported Schizophrenia/Schizoaffective Diagnosis in Past: Yes   Strengths: pt cooperative throughout assessment   Mental Health Symptoms Depression:   Sleep (too much or little); Irritability; Difficulty Concentrating   Duration of Depressive symptoms:  Duration of Depressive Symptoms: Greater than two weeks   Mania:   None   Anxiety:    Irritability; Restlessness   Psychosis:   Delusions (Employment- 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, worked for [employer] and [employer].)   Duration of Psychotic symptoms:  Duration of Psychotic Symptoms: Greater than six months   Trauma:   None   Obsessions:   None   Compulsions:   None   Inattention:    N/A   Hyperactivity/Impulsivity:   N/A   Oppositional/Defiant Behaviors:   N/A   Emotional Irregularity:   Recurrent suicidal behaviors/gestures/threats; Mood lability (per chart)   Other Mood/Personality Symptoms:   none    Mental Status Exam Appearance and self-care  Stature:   Average   Weight:   Thin   Clothing:   Casual   Grooming:   Normal   Cosmetic use:   None   Posture/gait:   Normal   Motor activity:   Not Remarkable   Sensorium  Attention:   Normal   Concentration:   Normal   Orientation:   X5   Recall/memory:   Normal   Affect and Mood  Affect:   Appropriate   Mood:   Euphoric; Irritable   Relating  Eye contact:   Normal   Facial expression:   Responsive   Attitude toward examiner:   Cooperative   Thought and Language  Speech flow:  Clear and Coherent   Thought content:   Ideas of Reference; Suspicious   Preoccupation:   Other (Comment) (Getting "paperwork" so that he could get services fomr "foundations" to help him get on his feet. He specificed he did not want the paperwork to mention schizophrenia or bipolar d/o/)   Hallucinations:   None   Organization:   Coherent   [employer] of Knowledge:   Average   Intelligence:   Average   Abstraction:   Functional   Judgement:   Fair; Impaired   Reality Testing:   Adequate   Insight:   Fair; Flashes of insight; Gaps; Lacking   Decision Making:   Confused   Social Functioning  Social Maturity:   Impulsive   Social Judgement:   Heedless   Stress  Stressors:   Family conflict; Transitions; Housing; Financial   Coping Ability:   Deficient supports; Overwhelmed; Exhausted   Skill Deficits:   Self-care; Self-control; Responsibility; Decision making   Supports:   Family; Support needed     Religion: Religion/Spirituality Are You A Religious Person?: Yes What is Your Religious Affiliation?:  (unknown) How Might This  Affect Treatment?: unknown  Leisure/Recreation: Leisure / Recreation Do You Have Hobbies?: Yes Leisure and Hobbies: reading, puzzles, watching TV per chart  Exercise/Diet: Exercise/Diet Do You Exercise?: Yes What Type of Exercise Do You Do?: Run/Walk How Many Times a Week Do You Exercise?: 6-7 times a week Have You Gained or Lost A Significant Amount of Weight in the  Past Six Months?: Yes-Lost Number of Pounds Lost?:  (unknown amount) Do You Follow a Special Diet?: Yes Type of Diet: Pt stated he does not eat pork. Do You Have Any Trouble Sleeping?: Yes Explanation of Sleeping Difficulties: 5-6 hours per night reported   CCA Employment/Education Employment/Work Situation: Employment / Work Situation Employment Situation: Employed Work Stressors: unknown Passenger transport manager has Been Impacted by Current Illness: No Has Patient ever Been in Equities trader?: No  Education: Education Is Patient Currently Attending School?: No Last Grade Completed: 12 (some college per pt.) Did Theme park manager?: Yes What Type of College Degree Do you Have?: no degree per pt Did You Have An Individualized Education Program (IIEP): No Did You Have Any Difficulty At School?: No   CCA Family/Childhood History Family and Relationship History: Family history Marital status: Single Does patient have children?: Yes (Pt denied having any children today.) How is patient's relationship with their children?: Pt denied having any children today (03/16/23). Earlier, per chart, he stated he has 2 children ages 41 and 64 yo daughters.  Childhood History:  Childhood History By whom was/is the patient raised?: Mother, Mother/father and step-parent Did patient suffer any verbal/emotional/physical/sexual abuse as a child?: No Has patient ever been sexually abused/assaulted/raped as an adolescent or adult?: No Witnessed domestic violence?: No Has patient been affected by domestic violence as an adult?: No        CCA Substance Use Alcohol/Drug Use: Alcohol / Drug Use Pain Medications: Please see MAR Prescriptions: Please see MAR Over the Counter: Please see MAR History of alcohol / drug use?: Yes (Use of THC) Longest period of sobriety (when/how long): unknown Negative Consequences of Use: Work / Programmer, multimedia, Copywriter, advertising relationships Withdrawal Symptoms: None Substance #1 Name of Substance 1: cannabis 1 - Age of First Use: 19 1 - Amount (size/oz): varies 1 - Frequency: 1-2 times per week 1 - Duration: ongoing 1 - Last Use / Amount: 3 days ago 1 - Method of Aquiring: unknown 1- Route of Use: smoke                       ASAM's:  Six Dimensions of Multidimensional Assessment  Dimension 1:  Acute Intoxication and/or Withdrawal Potential:   Dimension 1:  Description of individual's past and current experiences of substance use and withdrawal: No withdrawal reported  Dimension 2:  Biomedical Conditions and Complications:   Dimension 2:  Description of patient's biomedical conditions and  complications: No physical conditions or pain reported.  Dimension 3:  Emotional, Behavioral, or Cognitive Conditions and Complications:  Dimension 3:  Description of emotional, behavioral, or cognitive conditions and complications: Per history, Pt has a history of Schizophrenia  Dimension 4:  Readiness to Change:  Dimension 4:  Description of Readiness to Change criteria: Pt denied current use  Dimension 5:  Relapse, Continued use, or Continued Problem Potential:     Dimension 6:  Recovery/Living Environment:  Dimension 6:  Recovery/Iiving environment criteria description: Pt reported that he is now homeless  ASAM Severity Score: ASAM's Severity Rating Score: 10  ASAM Recommended Level of Treatment: ASAM Recommended Level of Treatment: Level II Intensive Outpatient Treatment   Substance use Disorder (SUD) Substance Use Disorder (SUD)  Checklist Symptoms of Substance Use: Continued use despite having a  persistent/recurrent physical/psychological problem caused/exacerbated by use  Recommendations for Services/Supports/Treatments: Recommendations for Services/Supports/Treatments Recommendations For Services/Supports/Treatments: SAIOP (Substance Abuse Intensive Outpatient Program)  Discharge Disposition:    DSM5 Diagnoses: Patient Active Problem List  Diagnosis Date Noted   Severe episode of recurrent major depressive disorder, without psychotic features (HCC)    Paranoid schizophrenia (HCC) 01/10/2021   Schizophrenia, paranoid (HCC) 01/09/2021     Referrals to Alternative Service(s): Referred to Alternative Service(s):   Place:   Date:   Time:    Referred to Alternative Service(s):   Place:   Date:   Time:    Referred to Alternative Service(s):   Place:   Date:   Time:    Referred to Alternative Service(s):   Place:   Date:   Time:     Ramya Vanbergen T, Counselor

## 2023-03-16 NOTE — ED Notes (Signed)
 Patient resting quietly in bed.  Respirations equal and unlabored, skin warm and dry, NAD. Routine safety checks conducted according to facility protocol. Will continue to monitor for safety.  

## 2023-03-17 MED ORDER — OLANZAPINE 5 MG PO TBDP: 5.0000 mg | ORAL_TABLET | Freq: Every day | ORAL | 0 refills | Status: DC

## 2023-03-17 MED ORDER — HYDROXYZINE HCL 25 MG PO TABS: 25.0000 mg | ORAL_TABLET | Freq: Three times a day (TID) | ORAL | 0 refills | Status: DC | PRN

## 2023-03-17 NOTE — ED Notes (Addendum)
Patient was discharged to home by provider. Patient was given a bus pass for transportation. Patient denies SI/HI and AVH.

## 2023-03-17 NOTE — ED Provider Notes (Signed)
Behavioral Health Progress Note  Date and Time: 03/17/2023 10:20 AM Name: Benjamin Simpson MRN:  [id number]  Subjective:  Benjamin Simpson is 32 y.o. male patient with a past psychiatric history of paranoid schizophrenia, suicidal ideation, and substance use disorder who presented to [facility] voluntarily and unaccompanied via [employer] requesting a mental health evaluation to prove Benjamin Simpson does not have any mental health issues. Per [employer], patient was picked up from an apartment complex off [address] sleeping on the premises and they didn't want to press charges.  During initial assessment patient appeared delusional with paranoid ideation.    Benjamin Simpson assessed face-to-face by this provider, chart reviewed and consulted with Dr. Gretta Cool on 03/17/23.  On evaluation Benjamin Simpson is lying in bed on the continuous assessment unit.  Once assessment is started, Benjamin Simpson sits up on side of bed.  There is no noted distress.  Benjamin Simpson is dressed appropriately but has a strong body odor.  Benjamin Simpson is alert, oriented x4, and cooperative.  His speech is clear and coherent, at normal rate and volume with good eye contact.  His mood is anxious which easily slips to irritable with congruent affect.  His thought process is liner but circumstantial with thought content being paranoid and delusional.  However, Benjamin Simpson continues to deny a prior psychiatric history and denies suicidal/self-harm/homicidal ideation, psychosis, and paranoia.  Benjamin Simpson states that Benjamin Simpson only came is to get a psychiatric evaluation stating that Benjamin Simpson has no mental health issues and needs to have it documented so Benjamin Simpson can give it to his mother and for his job.  Benjamin Simpson states that Benjamin Simpson works for [employer].  Reports Benjamin Simpson moved to [facility] from [place] about 6 months ago but has plans to move back because "I'm tired of being treated like a child.  I'm 89 years old about to be 7 and I can care for myself.  My mother treats me like shit and I'm tired of her putting me  out.  I don't hear voices; I don't see things.  Someone put paranoid schizophrenia on one or my papers at the hospital and now she is wanting me to get treatment.  I don't have mental illness.  I don't need all of the pills that you are giving me her.  I don't need an extensive evaluation.  I just need a note stating that I came her.  I got an evaluation and a prescription for my Biktavry, Ultram, and Hydrocodone.  Can you do that?"  Patient was informed that Benjamin Simpson would need to see a pain management, primary care, or infectious disease for those medications that any prescriptions written by this provider would be psychotropic.  Patient then became upset stated "I'm rich as fuck and I don't' have time to be sitting around here all day.   I don't need nothing just give me the paperwork with a list of diagnosis and let me out of here.      Objectively, patient continues to present as paranoid with delusional thinking.  Benjamin Simpson doesn't appear to be responding internal or external stimuli at this time.  Benjamin Simpson is easily irritated but had not made any aggressiveness towards staff or other patients.  There has been no behavioral outburst, and continues to deny suicidal/self-harm/homicidal ideation.     Diagnosis:  Final diagnoses:  Paranoid schizophrenia (HCC)    Total Time spent with patient: 20 minutes  Past Psychiatric History: Paranoid schizophrenia, suicidal ideation, substance induced mood disorder, MDD Past  Medical History:  Past Medical History:  Diagnosis Date   HIV (human immunodeficiency virus infection) (HCC)     Family History: History reviewed. No pertinent family history.   Unaware Family Psychiatric  History: Unaware Social History:   Currently homeless, unemployed Social History   Tobacco Use   Smoking status: Every Day    Current packs/day: 1.00    Average packs/day: 1 pack/day for 10.0 years (10.0 ttl pk-yrs)    Types: Cigarettes   Smokeless tobacco: Never   Tobacco comments:    Patient  refused  Vaping Use   Vaping status: Never Used  Substance Use Topics   Alcohol use: Yes    Comment: twice a week   Drug use: Not Currently    Types: Marijuana    Comment: last use 2021     Additional Social History:    Pain Medications: Please see MAR Prescriptions: Please see MAR Over the Counter: Please see MAR History of alcohol / drug use?: Yes (Use of THC) Longest period of sobriety (when/how long): unknown Negative Consequences of Use: Work / Programmer, multimedia, Copywriter, advertising relationships Withdrawal Symptoms: None Name of Substance 1: cannabis 1 - Age of First Use: 19 1 - Amount (size/oz): varies 1 - Frequency: 1-2 times per week 1 - Duration: ongoing 1 - Last Use / Amount: 3 days ago 1 - Method of Aquiring: unknown 1- Route of Use: smoke                  Sleep: Fair  Appetite:  Good  Current Medications:  Current Facility-Administered Medications  Medication Dose Route Frequency Provider Last Rate Last Admin   acetaminophen (TYLENOL) tablet 650 mg  650 mg Oral Q6H PRN Sunday Corn, NP       alum & mag hydroxide-simeth (MAALOX/MYLANTA) 200-200-20 MG/5ML suspension 30 mL  30 mL Oral Q4H PRN Sunday Corn, NP       bictegravir-emtricitabine-tenofovir AF (BIKTARVY) 50-200-25 MG per tablet 1 tablet  1 tablet Oral Daily Sunday Corn, NP   1 tablet at 03/17/23 1006   hydrOXYzine (ATARAX) tablet 25 mg  25 mg Oral TID PRN Sunday Corn, NP   25 mg at 03/16/23 2259   magnesium hydroxide (MILK OF MAGNESIA) suspension 30 mL  30 mL Oral Daily PRN Sunday Corn, NP       OLANZapine zydis (ZYPREXA) disintegrating tablet 5 mg  5 mg Oral QHS Sunday Corn, NP   5 mg at 03/16/23 2259   traZODone (DESYREL) tablet 50 mg  50 mg Oral QHS PRN Sunday Corn, NP   50 mg at 03/16/23 2259   Current Outpatient Medications  Medication Sig Dispense Refill   diclofenac Sodium (VOLTAREN) 1 % GEL Apply 4 g topically 4 (four) times daily. 50 g 0   traMADol (ULTRAM) 50 MG  tablet Take 50 mg by mouth every 12 (twelve) hours as needed for severe pain.     bictegravir-emtricitabine-tenofovir AF (BIKTARVY) 50-200-25 MG TABS tablet Take 1 tablet by mouth daily. For HIV information (Patient not taking: Reported on 03/16/2023) 30 tablet 0   HYDROcodone-acetaminophen (NORCO/VICODIN) 5-325 MG tablet Take 2 tablets by mouth every 4 (four) hours as needed. (Patient not taking: Reported on 03/16/2023) 10 tablet 0   lidocaine (LIDODERM) 5 % Place 1 patch onto the skin daily. Remove & Discard patch within 12 hours or as directed by MD (Patient not taking: Reported on 03/16/2023) 10 patch 0   olanzapine zydis (ZYPREXA) 15 MG disintegrating  tablet Take 1 tablet (15 mg total) by mouth at bedtime. For mood control (Patient not taking: Reported on 03/16/2023) 30 tablet 0    Labs  Lab Results:  Admission on 03/16/2023  Component Date Value Ref Range Status   POC Amphetamine UR 03/16/2023 None Detected  NONE DETECTED (Cut Off Level 1000 ng/mL) Final   POC Secobarbital (BAR) 03/16/2023 None Detected  NONE DETECTED (Cut Off Level 300 ng/mL) Final   POC Buprenorphine (BUP) 03/16/2023 None Detected  NONE DETECTED (Cut Off Level 10 ng/mL) Final   POC Oxazepam (BZO) 03/16/2023 None Detected  NONE DETECTED (Cut Off Level 300 ng/mL) Final   POC Cocaine UR 03/16/2023 None Detected  NONE DETECTED (Cut Off Level 300 ng/mL) Final   POC Methamphetamine UR 03/16/2023 None Detected  NONE DETECTED (Cut Off Level 1000 ng/mL) Final   POC Morphine 03/16/2023 Positive (A)  NONE DETECTED (Cut Off Level 300 ng/mL) Final   POC Methadone UR 03/16/2023 None Detected  NONE DETECTED (Cut Off Level 300 ng/mL) Final   POC Oxycodone UR 03/16/2023 None Detected  NONE DETECTED (Cut Off Level 100 ng/mL) Final   POC Marijuana UR 03/16/2023 Positive (A)  NONE DETECTED (Cut Off Level 50 ng/mL) Final  Admission on 11/15/2022, Discharged on 11/15/2022  Component Date Value Ref Range Status   SARS Coronavirus 2 by RT PCR  11/15/2022 NEGATIVE  NEGATIVE Final   Influenza A by PCR 11/15/2022 NEGATIVE  NEGATIVE Final   Influenza B by PCR 11/15/2022 NEGATIVE  NEGATIVE Final   Comment: (NOTE) The Xpert Xpress SARS-CoV-2/FLU/RSV plus assay is intended as an aid in the diagnosis of influenza from Nasopharyngeal swab specimens and should not be used as a sole basis for treatment. Nasal washings and aspirates are unacceptable for Xpert Xpress SARS-CoV-2/FLU/RSV testing.  Fact Sheet for Patients: BloggerCourse.com  Fact Sheet for Healthcare Providers: SeriousBroker.it  This test is not yet approved or cleared by the [place] FDA and has been authorized for detection and/or diagnosis of SARS-CoV-2 by FDA under an Emergency Use Authorization (EUA). This EUA will remain in effect (meaning this test can be used) for the duration of the COVID-19 declaration under Section 564(b)(1) of the Act, 21 U.S.C. section 360bbb-3(b)(1), unless the authorization is terminated or revoked.     Resp Syncytial Virus by PCR 11/15/2022 NEGATIVE  NEGATIVE Final   Comment: (NOTE) Fact Sheet for Patients: BloggerCourse.com  Fact Sheet for Healthcare Providers: SeriousBroker.it  This test is not yet approved or cleared by the [place] FDA and has been authorized for detection and/or diagnosis of SARS-CoV-2 by FDA under an Emergency Use Authorization (EUA). This EUA will remain in effect (meaning this test can be used) for the duration of the COVID-19 declaration under Section 564(b)(1) of the Act, 21 U.S.C. section 360bbb-3(b)(1), unless the authorization is terminated or revoked.  Performed at [facility], [address], [place], [place] [address]   Admission on 10/02/2022, Discharged on 10/02/2022  Component Date Value Ref Range Status   SARS Coronavirus 2 by RT PCR 10/02/2022 NEGATIVE  NEGATIVE Final    Comment: (NOTE) SARS-CoV-2 target nucleic acids are NOT DETECTED.  The SARS-CoV-2 RNA is generally detectable in upper respiratory specimens during the acute phase of infection. The lowest concentration of SARS-CoV-2 viral copies this assay can detect is 138 copies/mL. A negative result does not preclude SARS-Cov-2 infection and should not be used as the sole basis for treatment or other patient management decisions. A negative result may occur with  improper specimen collection/handling, submission of specimen other than nasopharyngeal swab, presence of viral mutation(s) within the areas targeted by this assay, and inadequate number of viral copies(<138 copies/mL). A negative result must be combined with clinical observations, patient history, and epidemiological information. The expected result is Negative.  Fact Sheet for Patients:  BloggerCourse.com  Fact Sheet for Healthcare Providers:  SeriousBroker.it  This test is no                          t yet approved or cleared by the [place] FDA and  has been authorized for detection and/or diagnosis of SARS-CoV-2 by FDA under an Emergency Use Authorization (EUA). This EUA will remain  in effect (meaning this test can be used) for the duration of the COVID-19 declaration under Section 564(b)(1) of the Act, 21 U.S.C.section 360bbb-3(b)(1), unless the authorization is terminated  or revoked sooner.       Influenza A by PCR 10/02/2022 NEGATIVE  NEGATIVE Final   Influenza B by PCR 10/02/2022 NEGATIVE  NEGATIVE Final   Comment: (NOTE) The Xpert Xpress SARS-CoV-2/FLU/RSV plus assay is intended as an aid in the diagnosis of influenza from Nasopharyngeal swab specimens and should not be used as a sole basis for treatment. Nasal washings and aspirates are unacceptable for Xpert Xpress SARS-CoV-2/FLU/RSV testing.  Fact Sheet for  Patients: BloggerCourse.com  Fact Sheet for Healthcare Providers: SeriousBroker.it  This test is not yet approved or cleared by the [place] FDA and has been authorized for detection and/or diagnosis of SARS-CoV-2 by FDA under an Emergency Use Authorization (EUA). This EUA will remain in effect (meaning this test can be used) for the duration of the COVID-19 declaration under Section 564(b)(1) of the Act, 21 U.S.C. section 360bbb-3(b)(1), unless the authorization is terminated or revoked.     Resp Syncytial Virus by PCR 10/02/2022 NEGATIVE  NEGATIVE Final   Comment: (NOTE) Fact Sheet for Patients: BloggerCourse.com  Fact Sheet for Healthcare Providers: SeriousBroker.it  This test is not yet approved or cleared by the [place] FDA and has been authorized for detection and/or diagnosis of SARS-CoV-2 by FDA under an Emergency Use Authorization (EUA). This EUA will remain in effect (meaning this test can be used) for the duration of the COVID-19 declaration under Section 564(b)(1) of the Act, 21 U.S.C. section 360bbb-3(b)(1), unless the authorization is terminated or revoked.  Performed at [facility], [address] [place], [place] [address]   Admission on 09/21/2022, Discharged on 09/21/2022  Component Date Value Ref Range Status   SARS Coronavirus 2 by RT PCR 09/21/2022 NEGATIVE  NEGATIVE Final   Influenza A by PCR 09/21/2022 NEGATIVE  NEGATIVE Final   Influenza B by PCR 09/21/2022 NEGATIVE  NEGATIVE Final   Comment: (NOTE) The Xpert Xpress SARS-CoV-2/FLU/RSV plus assay is intended as an aid in the diagnosis of influenza from Nasopharyngeal swab specimens and should not be used as a sole basis for treatment. Nasal washings and aspirates are unacceptable for Xpert Xpress SARS-CoV-2/FLU/RSV testing.  Fact Sheet for  Patients: BloggerCourse.com  Fact Sheet for Healthcare Providers: SeriousBroker.it  This test is not yet approved or cleared by the [place] FDA and has been authorized for detection and/or diagnosis of SARS-CoV-2 by FDA under an Emergency Use Authorization (EUA). This EUA will remain in effect (meaning this test can be used) for the duration of the COVID-19 declaration under Section 564(b)(1) of the Act, 21 U.S.C. section 360bbb-3(b)(1), unless the authorization is  terminated or revoked.     Resp Syncytial Virus by PCR 09/21/2022 NEGATIVE  NEGATIVE Final   Comment: (NOTE) Fact Sheet for Patients: BloggerCourse.com  Fact Sheet for Healthcare Providers: SeriousBroker.it  This test is not yet approved or cleared by the [place] FDA and has been authorized for detection and/or diagnosis of SARS-CoV-2 by FDA under an Emergency Use Authorization (EUA). This EUA will remain in effect (meaning this test can be used) for the duration of the COVID-19 declaration under Section 564(b)(1) of the Act, 21 U.S.C. section 360bbb-3(b)(1), unless the authorization is terminated or revoked.  Performed at [facility], [address] [place], [place] [address]     Blood Alcohol level:  Lab Results  Component Value Date   ETH <10 02/03/2021   ETH <10 01/10/2021    Metabolic Disorder Labs: No results found for: "HGBA1C", "MPG" No results found for: "PROLACTIN" No results found for: "CHOL", "TRIG", "HDL", "CHOLHDL", "VLDL", "LDLCALC"  Therapeutic Lab Levels: No results found for: "LITHIUM" No results found for: "VALPROATE" No results found for: "CBMZ"  Physical Findings   AUDIT    Flowsheet Row [facility]  Alcohol Use Disorder Identification Test Final Score (AUDIT) 0      PHQ2-9     Flowsheet Row Benjamin from 02/03/2021 in [facility] Emergency Department at [facility]  PHQ-2 Total Score 3  PHQ-9 Total Score 9      Flowsheet Row Benjamin from 03/16/2023 in [facility] Most recent reading at 03/16/2023  2:13 PM Benjamin from 03/05/2023 in [facility] Department at [facility] Most recent reading at 03/05/2023  4:15 AM Benjamin from 03/05/2023 in [facility] Emergency Department at [facility] Most recent reading at 03/05/2023  1:39 AM  C-SSRS RISK CATEGORY No Risk No Risk No Risk        Musculoskeletal  Strength & Muscle Tone: within normal limits Gait & Station: normal Patient leans: N/A  Psychiatric Specialty Exam  Presentation  General Appearance:  Appropriate for Environment (Odorous)  Eye Contact: Good  Speech: Clear and Coherent; Normal Rate  Speech Volume: Normal  Handedness: Right   Mood and Affect  Mood: Anxious  Affect: Congruent   Thought Process  Thought Processes: Disorganized  Descriptions of Associations:Circumstantial  Orientation:Full (Time, Place and Person)  Thought Content:Delusions; Paranoid Ideation; Rumination  Diagnosis of Schizophrenia or Schizoaffective disorder in past: Yes  Duration of Psychotic Symptoms: Greater than six months   Hallucinations:Hallucinations: None  Ideas of Reference:Paranoia; Delusions  Suicidal Thoughts:Suicidal Thoughts: No  Homicidal Thoughts:Homicidal Thoughts: No   Sensorium  Memory: Immediate Fair; Recent Fair  Judgment: Poor  Insight: Lacking   Executive Functions  Concentration: Fair  Attention Span: Fair  Recall: Fair  Fund of Knowledge: Good  Language: Good   Psychomotor Activity  Psychomotor Activity: Psychomotor Activity: Normal   Assets  Assets: Communication Skills; Desire for Improvement; Financial Resources/Insurance; Leisure Time; Physical Health   Sleep  Sleep: Sleep: Fair Number of Hours  of Sleep: 6   Nutritional Assessment (For OBS and [facility]BC admissions only) Has the patient had a weight loss or gain of 10 pounds or more in the last 3 months?: No Has the patient had a decrease in food intake/or appetite?: No Does the patient have dental problems?: No Does the patient have eating habits or behaviors that may be indicators of an eating disorder including binging or inducing vomiting?: No  Has the patient recently lost weight without trying?: 0 Has the patient been eating poorly because of a decreased appetite?: 0 Malnutrition Screening Tool Score: 0    Physical Exam  Physical Exam Vitals and nursing note reviewed.  Constitutional:      General: Benjamin Simpson is not in acute distress.    Appearance: Normal appearance. Benjamin Simpson is not ill-appearing.  HENT:     Head: Normocephalic.  Eyes:     Conjunctiva/sclera: Conjunctivae normal.  Cardiovascular:     Rate and Rhythm: Normal rate.  Pulmonary:     Effort: Pulmonary effort is normal. No respiratory distress.  Musculoskeletal:        General: Normal range of motion.     Cervical back: Normal range of motion.  Skin:    General: Skin is warm and dry.  Neurological:     General: No focal deficit present.     Mental Status: Benjamin Simpson is alert and oriented to person, place, and time.  Psychiatric:        Mood and Affect: Mood is anxious.        Speech: Speech normal.        Behavior: Behavior is cooperative.        Thought Content: Thought content is paranoid and delusional.        Judgment: Judgment is impulsive.    Review of Systems  Constitutional:        No other complaints voiced  Musculoskeletal:        Requesting pain medication.  Did not inform what pain was being treated.   Psychiatric/Behavioral:  Hallucinations: Denies. Substance abuse: Denies. Suicidal ideas: Denies. The patient is nervous/anxious. Insomnia: Denies.  All other systems reviewed and are negative.  Blood pressure (!) 109/59, pulse (!) 56, temperature 98.3 F  (36.8 C), temperature source Oral, resp. rate 16, SpO2 100%. There is no height or weight on file to calculate BMI.  Treatment Plan Summary: Daily contact with patient to assess and evaluate symptoms and progress in treatment, Medication management, and Plan Inpatient psychiatric treatment.      Adron Geisel, NP 03/17/2023 10:20 AM

## 2023-03-17 NOTE — ED Provider Notes (Signed)
FBC/OBS ASAP Discharge Summary  Date and Time: 03/17/2023 12:47 PM  Name: Benjamin Simpson  MRN:  [id number]   Discharge Diagnoses:  Final diagnoses:  Paranoid schizophrenia [facility])    Stay Summary: Subjective:  Benjamin Simpson is 32 y.o. male patient with a past psychiatric history of paranoid schizophrenia, suicidal ideation, and substance use disorder who presented to [facility] voluntarily and unaccompanied via [employer] requesting a mental health evaluation to prove he does not have any mental health issues. Per [employer], patient was picked up from an apartment complex off [address] sleeping on the premises and they didn't want to press charges.  During initial assessment patient appeared delusional with paranoid ideation.     Benjamin Simpson assessed face-to-face by this provider, chart reviewed and consulted with Benjamin Simpson on 03/17/23.  On evaluation Benjamin Simpson is lying in bed on the continuous assessment unit.  Once assessment is started, he sits up on side of bed.  There is no noted distress.  He is dressed appropriately but has a strong body odor.  Benjamin Simpson is alert, oriented x4, and cooperative.  His speech is clear and coherent, at normal rate and volume with good eye contact.  His mood is anxious which easily slips to irritable with congruent affect.  His thought process is liner but circumstantial with thought content being paranoid and delusional.  However, he continues to deny a prior psychiatric history and denies suicidal/self-harm/homicidal ideation, psychosis, and paranoia.  He states that he only came is to get a psychiatric evaluation stating that he has no mental health issues and needs to have it documented so he can give it to his mother and for his job.  He states that he works for [employer].  Reports he moved to [facility] from [place] about 6 months ago but has plans to move back because "I'm tired of being treated like a child.  I'm 9 years old about to be  81 and I can care for myself.  My mother treats me like shit and I'm tired of her putting me out.  I don't hear voices; I don't see things.  Someone put paranoid schizophrenia on one or my papers at the hospital and now she is wanting me to get treatment.  I don't have mental illness.  I don't need all of the pills that you are giving me her.  I don't need an extensive evaluation.  I just need a note stating that I came her.  I got an evaluation and a prescription for my Biktavry, Ultram, and Hydrocodone.  Can you do that?"  Patient was informed that he would need to see a pain management, primary care, or infectious disease for those medications that any prescriptions written by this provider would be psychotropic.  Patient then became upset stated "I'm rich as fuck and I don't' have time to be sitting around here all day.   I don't need nothing just give me the paperwork with a list of diagnosis and let me out of here.      Objectively, patient continues to present as paranoid with delusional thinking.  He doesn't appear to be responding internal or external stimuli at this time.  He is easily irritated but had not made any aggressiveness towards staff or other patients.  There has been no behavioral outburst, and continues to deny suicidal/self-harm/homicidal ideation.    Patient states that he doesn't want inpatient psychiatric treatment.  There have been not behaviors  or statements that indicates that patient is a danger to himself or other and not criteria for involuntary commitment.  Will discharge with resources for outpatient psychiatric services.    Benjamin Simpson was admitted for Paranoid schizophrenia [facility]), crisis management, safety, and stabilization.  Medical problems were identified and treated as needed.  Home medications were restarted, adjusted, or new medications added as needed or appropriate.  Medications treated with during admission are as follows.   bictegravir-emtricitabine-tenofovir AF  1 tablet Oral Daily   OLANZapine zydis  5 mg Oral QHS   Labs ordered for review:  Routine labs: CBC/Diff, CMP, HgB A1c, Lipid Profile, Magnesium, Prolactin, TSH  Lab Orders         CBC with Differential/Platelet         Comprehensive metabolic panel         Hemoglobin A1c         Magnesium         Ethanol         Lipid panel         TSH         Prolactin         POCT Urine Drug Screen - (I-Screen)      Benjamin Simpson was evaluated by the treatment team for stability and plans for continued recovery upon discharge. Benjamin Simpson [facility] 's motivation was an integral factor for scheduling further treatment. Employment, transportation, bed availability, health status, family support, and any pending legal issues were also considered during stay. He was offered further treatment options upon discharge including but not limited to Residential, Intensive Outpatient, and Outpatient treatment.   Benjamin Simpson refused inpatient psychiatric and outpatient treatment.  Informed resources would be given in case he changed his mind.    Discharge Instructions   None     Total Time spent with patient: 20 minutes  Past Psychiatric History: Paranoid schizophrenia, suicidal ideation, substance induced mood disorder, MDD  Past Medical History:  Past Medical History:  Diagnosis Date   HIV (human immunodeficiency virus infection) (HCC)     Family History: Unaware Family Psychiatric History: Unaware Social History: homeless unemployed Social History   Tobacco Use   Smoking status: Every Day    Current packs/day: 1.00    Average packs/day: 1 pack/day for 10.0 years (10.0 ttl pk-yrs)    Types: Cigarettes   Smokeless tobacco: Never   Tobacco comments:    Patient refused  Vaping Use   Vaping status: Never Used  Substance Use Topics   Alcohol use: Yes    Comment: twice a week   Drug use: Not Currently    Types: Marijuana    Comment: last use 2021     Tobacco Cessation:  A prescription for an FDA-approved tobacco cessation medication was offered at discharge and the patient refused  Current Medications:  Current Facility-Administered Medications  Medication Dose Route Frequency Provider Last Rate Last Admin   acetaminophen (TYLENOL) tablet 650 mg  650 mg Oral Q6H PRN Sunday Corn, NP       alum & mag hydroxide-simeth (MAALOX/MYLANTA) 200-200-20 MG/5ML suspension 30 mL  30 mL Oral Q4H PRN Sunday Corn, NP       bictegravir-emtricitabine-tenofovir AF (BIKTARVY) 50-200-25 MG per tablet 1 tablet  1 tablet Oral Daily Sunday Corn, NP   1 tablet at 03/17/23 1006   hydrOXYzine (ATARAX) tablet 25 mg  25 mg Oral TID PRN Sunday Corn, NP   25 mg at 03/16/23  2259   magnesium hydroxide (MILK OF MAGNESIA) suspension 30 mL  30 mL Oral Daily PRN Sunday Corn, NP       OLANZapine zydis (ZYPREXA) disintegrating tablet 5 mg  5 mg Oral QHS Sunday Corn, NP   5 mg at 03/16/23 2259   traZODone (DESYREL) tablet 50 mg  50 mg Oral QHS PRN Sunday Corn, NP   50 mg at 03/16/23 2259   Current Outpatient Medications  Medication Sig Dispense Refill   diclofenac Sodium (VOLTAREN) 1 % GEL Apply 4 g topically 4 (four) times daily. 50 g 0   traMADol (ULTRAM) 50 MG tablet Take 50 mg by mouth every 12 (twelve) hours as needed for severe pain.     bictegravir-emtricitabine-tenofovir AF (BIKTARVY) 50-200-25 MG TABS tablet Take 1 tablet by mouth daily. For HIV information (Patient not taking: Reported on 03/16/2023) 30 tablet 0   HYDROcodone-acetaminophen (NORCO/VICODIN) 5-325 MG tablet Take 2 tablets by mouth every 4 (four) hours as needed. (Patient not taking: Reported on 03/16/2023) 10 tablet 0   lidocaine (LIDODERM) 5 % Place 1 patch onto the skin daily. Remove & Discard patch within 12 hours or as directed by MD (Patient not taking: Reported on 03/16/2023) 10 patch 0   olanzapine zydis (ZYPREXA) 15 MG disintegrating tablet Take 1 tablet (15  mg total) by mouth at bedtime. For mood control (Patient not taking: Reported on 03/16/2023) 30 tablet 0    PTA Medications:  Facility Ordered Medications  Medication   acetaminophen (TYLENOL) tablet 650 mg   alum & mag hydroxide-simeth (MAALOX/MYLANTA) 200-200-20 MG/5ML suspension 30 mL   magnesium hydroxide (MILK OF MAGNESIA) suspension 30 mL   hydrOXYzine (ATARAX) tablet 25 mg   traZODone (DESYREL) tablet 50 mg   bictegravir-emtricitabine-tenofovir AF (BIKTARVY) 50-200-25 MG per tablet 1 tablet   OLANZapine zydis (ZYPREXA) disintegrating tablet 5 mg   PTA Medications  Medication Sig   diclofenac Sodium (VOLTAREN) 1 % GEL Apply 4 g topically 4 (four) times daily.   olanzapine zydis (ZYPREXA) 15 MG disintegrating tablet Take 1 tablet (15 mg total) by mouth at bedtime. For mood control (Patient not taking: Reported on 03/16/2023)   bictegravir-emtricitabine-tenofovir AF (BIKTARVY) 50-200-25 MG TABS tablet Take 1 tablet by mouth daily. For HIV information (Patient not taking: Reported on 03/16/2023)   lidocaine (LIDODERM) 5 % Place 1 patch onto the skin daily. Remove & Discard patch within 12 hours or as directed by MD (Patient not taking: Reported on 03/16/2023)   HYDROcodone-acetaminophen (NORCO/VICODIN) 5-325 MG tablet Take 2 tablets by mouth every 4 (four) hours as needed. (Patient not taking: Reported on 03/16/2023)       02/03/2021    9:48 AM  Depression screen PHQ 2/9  Decreased Interest 1  Down, Depressed, Hopeless 2  PHQ - 2 Score 3  Altered sleeping 3  Tired, decreased energy 1  Change in appetite 0  Feeling bad or failure about yourself  1  Trouble concentrating 0  Moving slowly or fidgety/restless 0  Suicidal thoughts 1  PHQ-9 Score 9  Difficult doing work/chores Somewhat difficult    Flowsheet Row Benjamin from 03/16/2023 in [facility] Most recent reading at 03/16/2023  2:13 PM Benjamin from 03/05/2023 in [facility] Emergency Department at [facility] Most recent reading at 03/05/2023  4:15 AM Benjamin from 03/05/2023 in [facility] Most recent reading at 03/05/2023  1:39 AM  C-SSRS RISK CATEGORY No Risk No Risk  No Risk       Musculoskeletal  Strength & Muscle Tone: within normal limits Gait & Station: normal Patient leans: N/A  Psychiatric Specialty Exam  Presentation  General Appearance:  Appropriate for Environment (Odorous)  Eye Contact: Good  Speech: Clear and Coherent; Normal Rate  Speech Volume: Normal  Handedness: Right   Mood and Affect  Mood: Anxious  Affect: Congruent   Thought Process  Thought Processes: Disorganized  Descriptions of Associations:Circumstantial  Orientation:Full (Time, Place and Person)  Thought Content:Delusions; Paranoid Ideation; Rumination  Diagnosis of Schizophrenia or Schizoaffective disorder in past: Yes  Duration of Psychotic Symptoms: Greater than six months   Hallucinations:Hallucinations: None  Ideas of Reference:Paranoia; Delusions  Suicidal Thoughts:Suicidal Thoughts: No  Homicidal Thoughts:Homicidal Thoughts: No   Sensorium  Memory: Immediate Fair; Recent Fair  Judgment: Poor  Insight: Lacking   Executive Functions  Concentration: Fair  Attention Span: Fair  Recall: Fair  Fund of Knowledge: Good  Language: Good   Psychomotor Activity  Psychomotor Activity: Psychomotor Activity: Normal   Assets  Assets: Communication Skills; Desire for Improvement; Financial Resources/Insurance; Leisure Time; Physical Health   Sleep  Sleep: Sleep: Fair Number of Hours of Sleep: 6   Nutritional Assessment (For OBS and FBC admissions only) Has the patient had a weight loss or gain of 10 pounds or more in the last 3 months?: No Has the patient had a decrease in food intake/or appetite?: No Does the patient have dental problems?: No Does the patient have eating habits or behaviors that may be  indicators of an eating disorder including binging or inducing vomiting?: No Has the patient recently lost weight without trying?: 0 Has the patient been eating poorly because of a decreased appetite?: 0 Malnutrition Screening Tool Score: 0    Physical Exam  Physical Exam Vitals and nursing note reviewed.  Constitutional:      General: He is not in acute distress.    Appearance: Normal appearance. He is not ill-appearing.  HENT:     Head: Normocephalic.  Eyes:     Conjunctiva/sclera: Conjunctivae normal.  Cardiovascular:     Rate and Rhythm: Normal rate.  Pulmonary:     Effort: Pulmonary effort is normal. No respiratory distress.  Musculoskeletal:        General: Normal range of motion.     Cervical back: Normal range of motion.  Skin:    General: Skin is warm and dry.  Neurological:     General: No focal deficit present.     Mental Status: He is alert and oriented to person, place, and time.  Psychiatric:        Mood and Affect: Mood is anxious.        Speech: Speech normal.        Behavior: Behavior is cooperative.        Thought Content: Thought content is paranoid and delusional.        Judgment: Judgment is impulsive.    Review of Systems  Constitutional:        No other complaints voiced  Musculoskeletal:        Requesting pain medication.  Did not inform what pain was being treated.   Psychiatric/Behavioral:  Hallucinations: Denies. Substance abuse: Denies. Suicidal ideas: Denies. The patient is nervous/anxious. Insomnia: Denies.  All other systems reviewed and are negative.  Blood pressure (!) 109/59, pulse (!) 56, temperature 98.3 F (36.8 C), temperature source Oral, resp. rate 16, SpO2 100%. There is no height or weight on file  to calculate BMI.  Demographic Factors:  Male and Unemployed  Loss Factors: homeless  Historical Factors: Impulsivity  Risk Reduction Factors:   Sense of responsibility to family and Positive social support  Continued  Clinical Symptoms:  Previous Psychiatric Diagnoses and Treatments  Cognitive Features That Contribute To Risk:  None    Suicide Risk:  Minimal: No identifiable suicidal ideation.  Patients presenting with no risk factors but with morbid ruminations; may be classified as minimal risk based on the severity of the depressive symptoms  Plan Of Care/Follow-up recommendations:  Other:  Follow up with resources given  Disposition: No evidence of imminent risk to self or others at present.   Supportive therapy provided about ongoing stressors. Discussed crisis plan, support from social network, calling 911, coming to the Emergency Department, and calling Suicide Hotline.  Lamiya Naas, NP 03/17/2023, 12:47 PM

## 2023-03-17 NOTE — ED Notes (Signed)
Pt sleeping at present, no distress noted.  Monitoring for safety. 

## 2023-03-17 NOTE — Discharge Instructions (Addendum)
[facility]: Outpatient psychiatric Services:   Please see the walk in hours listed below.  Medication Management New Patient needing Medication Management Walk-in, and Existing Patients needing to see a provider for management coming as a walk in   Monday thru Friday 8:00 AM first come first serve until slots are full.  Recommend being there by 7:15 AM to ensure a slot is open.  Therapy New Patient Therapy Intake and Existing Patients needing to see therapist coming in as a walk in.   Monday, Wednesday, and Thursday morning at 8:00 am first come first serve.  Recommend being there by 7:15 AM to ensure a slot is open.    Every 1st, 2nd, and 3rd Friday at 1:00 PM first come first serve until slots are full.  Will still need to come in that morning at 7:15 AM to get registered for an afternoon slot.  For all walk-ins we ask that you arrive by 7:15 am because patients will be seen in there order of arrival (FIRST COME FIRST SERVE) Availability is limited, therefore you may not be seen on the same day that you walk in if all slots are full.    Our goal is to serve and meet the needs of our community to the best of our ability.      Substance Abuse Treatment Programs  Intensive Outpatient Programs [facility]     [address]      [place], [place]                   [phone]       [facility] [address] [place] [address] [place], [place] [phone]  [facility]     (Inpatient and outpatient)     [address]           [phone]    [facility] [phone] (Suboxone and Methadone)  [address]      [place], [place] [address]      [phone]       [address] [place], [place] [phone]  [facility] (Outpatient/Inpatient, Chemical)    (insurance only) [phone]             [facility] (Groups & Residential) [place],  [place] [phone]     [facility]     [address]     [place], [place]      [phone]       [facility] (for caregivers and family) [phone] [address] [place], [place] [phone]      Residential Treatment Programs [facility]      [address] [place], [place] [address]  [phone]       [facility] [address] [place], [place] [address] [phone]  [facility] [place]        [phone]       [facility] [phone]  [facility] ([facility]             [address]                                         [place], [place]  [phone] or [phone]                               [facility] [address] [place], [address] [phone]  [facility]    [address]      [place], [place]     [phone]       [facility] [address] [place], [place] [phone]  [facility]   [address] [place], [place] [address]     [phone]      Admissions: 8am-3pm M-F  [facility] (RTS) [address] [place], [place] [phone]  [facility]: Residential Program [phone] Days)   [place], [place]      [phone] or [phone]     [facility]: [facility] [place], [place] (Walk in Hours over the weekend or by referral)  [facility] [address] [place], [place], [place] [address] [phone]  Crisis Mobile: [facility]:  [phone] (for crisis response 24 hours a day) [facility] Hotline:      5045449628 Outpatient Psychiatry and Counseling  [facility]: Mobile Crisis Management 24 hours:  [phone]  [facility] of the [employer] sliding scale fee and walk in schedule: M-F 8am-12pm/1pm-3pm [address]  [place], [place] [address] [phone]  [facility] [address] [place], [place] [address] [phone]  [facility] (Formerly known as [facility] [employer])- new patient walk-in appointments available Monday - Friday 8am -3pm.          [address] [place], [place] [address] [phone] or crisis line- [phone]  [facility] Outpatient Services/ Intensive Outpatient Therapy Program [address] [place], [place] [address] [phone]  [facility] Health                  Crisis Services      [phone] [address]     [place], [place] [address]                 [facility]   [facility] [phone]. [address] [place], [place] [address]   [facility]          [address] [place]  [place], [place] [address]       [phone]  [facility] [address] [place], [place] [address] [phone]  [facility]    [address]    [place], [place] [address]     [phone]       Andee Poles, MD     [address]     [place] [address]     [phone]       [facility] [address] [place] [address]  [facility]     [address] [place], [place]      [phone]       [facility] Eulogio Ditch, MD [address] [place], [place] [address] [phone]  [facility]     [address]     [place], [place] [address]     [phone]       [facility] [address] [place], [place] [address] [phone] Resources for Temporary  Residential Assistance/Crisis Theme park manager Center [facility]) M-F 8am-3pm   [address] [place], [place] [address]   [phone] Services include: laundry, barbering, support groups, case management, phone  & computer access, showers, AA/NA mtgs, mental  health/substance abuse nurse, job skills class, disability information, VA assistance, spiritual classes, etc.   HOMELESS SHELTERS  [facility]     [facility]   [address] [place]     [phone]              [employer] (women and children)       [address] [place], [place] [address] [phone] [email or web address] for application and process Application Required  [facility]   [address]    [place] [address]     [phone]                    [facility] [place] [address] [place], [place] [address] [phone] application appt.) Application Required  [facility] (women only)    [address]     [place], [place] [address]     [phone]      Intake starts 6pm daily Need valid ID, SSC, & Police report [employer] [address] [place], [place] [phone] Application Required  [employer] (men only)     [address]      [place], [place]     [phone]       [facility] of the [place] (Pregnant women only) [address] [place], [place] [phone]  [facility]      [address]      [place], [place] [address]     [phone]             [facility] [address] [place], [place] [phone] 90 day commitment/SA/Application process  [facility](men only)     [address]     [place], [place]     [phone]       Check-in at [facility] [address] [place], [place] [address] [phone] Men/Women/Women and Children must be there by 7 pm  [facility] [place], [place] [phone]                  [facility] Address: [address] [place], [place], [place] [address] Phone: [phone]  Supported Employment The supported employment program is a person-centered, individualized,  evidence-based support service that helps members choose, acquire, and maintain competitive employment in our community. This service supports the varying needs of individuals and promotes community inclusion and employment success. Members enrolled in the supported employment program can expect the following:  Development of an individual career plan Community based job placement Job shadowing Job development On-site job Furniture conservator/restorer and support  Supported Education Supported education helps our members receive the education and training they need to achieve their learning and recovery goals. This will assist members with becoming gainfully employed in the job or career of their choice. The program includes assistance with: Registering for disability accommodations Enrolling in school and registering for classes Learning communication skills Scheduling tutoring sessions within your school [facility] partners with Vocational Rehabilitation to help increase the success of clients seeking employment and educational goals.  Want to learn more about our programs?   Please contact our intake department INTAKE: [phone] Ext [phone]  Mailing: [address] [address]   [place], [place] [address]   [email or web address]   [facility] Center  Hours Monday - Friday: Services: 8:00AM - 3:00PM Offices: 8:00AM - 5:00PM  Physical Address [address] [place], [place] [address]   Please use this address for [facility] Mailing Address [address] [address] [place], [place] [address]  The [facility] helps people reconnect This is a safe place to rest, take care of basic needs and access the services and community that make all the difference. Our guests come to the [facility] to take a class, do laundry, meet with a case manager or to get their mail. Sometimes they just need to sit in our dayroom and enjoy a conversation.  Here you will find everything from shower facilities to a computer lab, a  mail room, classrooms and meeting spaces.  The IRC helps people reconnect with their own lives and with the community at large.  A caring community setting One of the most exciting aspects of the IRC is that so many individuals and organizations in the community are a part of the everyday experience. Whether it's a hair stylist or law firm offering services right in-house, our partners make the [facility] a truly interactive resource center where services are brought to our guests. The IRC brings together a comprehensive community of talented people who not only want to help solve problems, but also to be a part of our guests' lives.  Integrated Care We take a person-centered approach to assistance that includes: Case management Geneticist, molecular Medical clinic Mental health nurse Referrals  Fundamental Services We start with necessities: Midwife and Armed forces operational officer addresses and mailboxes Replacement IDs Onsite barbershop Storage lockers [place] winter warming center  Self-Sufficiency We connect our guests with: Skilled trade classes Job skills classes Resume and jobs application assistance Interview training GED Academic librarian

## 2023-09-18 ENCOUNTER — Other Ambulatory Visit: Payer: Self-pay

## 2023-09-18 ENCOUNTER — Emergency Department (HOSPITAL_COMMUNITY)
Admission: EM | Admit: 2023-09-18 | Discharge: 2023-09-19 | Discharge status: Against Medical Advice | Payer: MEDICAID | Attending: Emergency Medicine | Admitting: Emergency Medicine

## 2023-09-18 ENCOUNTER — Encounter (HOSPITAL_COMMUNITY): Payer: Self-pay | Admitting: Emergency Medicine

## 2023-09-18 ENCOUNTER — Emergency Department (HOSPITAL_COMMUNITY): Payer: MEDICAID

## 2023-09-18 DIAGNOSIS — Z5321 Procedure and treatment not carried out due to patient leaving prior to being seen by health care provider: Secondary | ICD-10-CM | POA: Insufficient documentation

## 2023-09-18 DIAGNOSIS — R42 Dizziness and giddiness: Secondary | ICD-10-CM | POA: Diagnosis present

## 2023-09-18 DIAGNOSIS — S2232XA Fracture of one rib, left side, initial encounter for closed fracture: Secondary | ICD-10-CM | POA: Diagnosis not present

## 2023-09-18 DIAGNOSIS — W1830XA Fall on same level, unspecified, initial encounter: Secondary | ICD-10-CM | POA: Diagnosis not present

## 2023-09-18 HISTORY — DX: Other psychoactive substance abuse, uncomplicated: F19.10

## 2023-09-18 HISTORY — DX: Paranoid schizophrenia: F20.0

## 2023-09-18 HISTORY — DX: Arteriovenous malformation, site unspecified: Q27.30
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
## 2023-09-18 NOTE — ED Notes (Signed)
 Pt refused blood draw, nurse made aware.  KM

## 2023-09-18 NOTE — ED Triage Notes (Signed)
 Patient arrived with EMS reports feeling dizzy/lightheaded this evening , alert and oriented /ambulatory , respirations unlabored. CBG=104.

## 2023-09-18 NOTE — ED Notes (Signed)
Patient refused blood test

## 2023-09-18 NOTE — ED Notes (Signed)
 Evaluated by PA at arrival , cleared to wait at waiting area .

## 2023-09-19 ENCOUNTER — Emergency Department (HOSPITAL_COMMUNITY)
Admission: EM | Admit: 2023-09-19 | Discharge: 2023-09-19 | Disposition: A | Discharge status: Home / Self Care | Payer: MEDICAID | Attending: Emergency Medicine | Admitting: Emergency Medicine

## 2023-09-19 ENCOUNTER — Other Ambulatory Visit: Payer: Self-pay

## 2023-09-19 DIAGNOSIS — W1830XA Fall on same level, unspecified, initial encounter: Secondary | ICD-10-CM | POA: Insufficient documentation

## 2023-09-19 DIAGNOSIS — S2232XA Fracture of one rib, left side, initial encounter for closed fracture: Secondary | ICD-10-CM | POA: Insufficient documentation

## 2023-09-19 MED ORDER — IBUPROFEN 400 MG PO TABS
600.0000 mg | ORAL_TABLET | Freq: Once | ORAL | Status: AC
  Administered 2023-09-19: 600 mg via ORAL
  Filled 2023-09-19: qty 1

## 2023-09-19 NOTE — ED Notes (Signed)
 Pt called 3x for room. No response.
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
## 2023-09-19 NOTE — ED Notes (Signed)
 Pt states he was here for rib pain and that "I never finished treatment because I fell asleep and they were supposed to give me a pain pill." PT denies dizziness at this time but states he has pain in left rib

## 2023-09-19 NOTE — ED Triage Notes (Signed)
 Patient reports dizziness and lightheaded this evening .

## 2023-09-19 NOTE — Discharge Instructions (Addendum)
 It was a pleasure taking part in your care. Please continue taking ibuprofen  every 6 hours as needed

## 2023-11-11 ENCOUNTER — Encounter (HOSPITAL_COMMUNITY): Payer: Self-pay

## 2023-11-11 ENCOUNTER — Emergency Department (HOSPITAL_COMMUNITY)
Admission: EM | Admit: 2023-11-11 | Discharge: 2023-11-13 | Disposition: A | Discharge status: Home / Self Care | Payer: MEDICAID | Attending: Emergency Medicine | Admitting: Emergency Medicine

## 2023-11-11 ENCOUNTER — Encounter (HOSPITAL_COMMUNITY): Payer: Self-pay | Admitting: *Deleted

## 2023-11-11 ENCOUNTER — Ambulatory Visit (HOSPITAL_COMMUNITY)
Admission: EM | Admit: 2023-11-11 | Discharge: 2023-11-11 | Disposition: A | Discharge status: Home / Self Care | Payer: MEDICAID | Source: Home / Self Care | Attending: Family Medicine | Admitting: Family Medicine

## 2023-11-11 ENCOUNTER — Ambulatory Visit (INDEPENDENT_AMBULATORY_CARE_PROVIDER_SITE_OTHER): Payer: MEDICAID

## 2023-11-11 ENCOUNTER — Other Ambulatory Visit: Payer: Self-pay

## 2023-11-11 DIAGNOSIS — F251 Schizoaffective disorder, depressive type: Secondary | ICD-10-CM

## 2023-11-11 DIAGNOSIS — R4689 Other symptoms and signs involving appearance and behavior: Secondary | ICD-10-CM | POA: Diagnosis present

## 2023-11-11 DIAGNOSIS — R109 Unspecified abdominal pain: Secondary | ICD-10-CM

## 2023-11-11 DIAGNOSIS — N3001 Acute cystitis with hematuria: Secondary | ICD-10-CM | POA: Insufficient documentation

## 2023-11-11 DIAGNOSIS — R0602 Shortness of breath: Secondary | ICD-10-CM | POA: Insufficient documentation

## 2023-11-11 DIAGNOSIS — Z79899 Other long term (current) drug therapy: Secondary | ICD-10-CM | POA: Insufficient documentation

## 2023-11-11 DIAGNOSIS — Z21 Asymptomatic human immunodeficiency virus [HIV] infection status: Secondary | ICD-10-CM | POA: Diagnosis not present

## 2023-11-11 DIAGNOSIS — N23 Unspecified renal colic: Secondary | ICD-10-CM | POA: Insufficient documentation

## 2023-11-11 DIAGNOSIS — R45851 Suicidal ideations: Secondary | ICD-10-CM | POA: Diagnosis not present

## 2023-11-11 DIAGNOSIS — R059 Cough, unspecified: Secondary | ICD-10-CM | POA: Insufficient documentation

## 2023-11-11 DIAGNOSIS — R509 Fever, unspecified: Secondary | ICD-10-CM | POA: Diagnosis present

## 2023-11-11 DIAGNOSIS — F6810 Factitious disorder, unspecified: Secondary | ICD-10-CM | POA: Diagnosis present

## 2023-11-11 DIAGNOSIS — N390 Urinary tract infection, site not specified: Secondary | ICD-10-CM

## 2023-11-11 LAB — URINALYSIS, ROUTINE W REFLEX MICROSCOPIC
Bilirubin Urine: NEGATIVE
Glucose, UA: NEGATIVE mg/dL
Ketones, ur: NEGATIVE mg/dL
Nitrite: NEGATIVE
Protein, ur: 30 mg/dL — AB
Specific Gravity, Urine: 1.012 (ref 1.005–1.030)
WBC, UA: >50 WBC/hpf (ref 0–5)
pH: 5 (ref 5.0–8.0)

## 2023-11-11 LAB — RAPID URINE DRUG SCREEN, HOSP PERFORMED
Amphetamines: NOT DETECTED
Barbiturates: NOT DETECTED
Benzodiazepines: NOT DETECTED
Cocaine: NOT DETECTED
Opiates: NOT DETECTED
Tetrahydrocannabinol: POSITIVE — AB

## 2023-11-11 LAB — POCT URINALYSIS DIP (MANUAL ENTRY)
Glucose, UA: NEGATIVE mg/dL
Nitrite, UA: NEGATIVE
Protein Ur, POC: 100 mg/dL — AB
Spec Grav, UA: 1.015
Urobilinogen, UA: 2 U/dL — AB
pH, UA: 6

## 2023-11-11 LAB — POCT INFLUENZA A/B
Influenza A, POC: NEGATIVE
Influenza B, POC: NEGATIVE

## 2023-11-11 MED ORDER — ACETAMINOPHEN 325 MG PO TABS
ORAL_TABLET | ORAL | Status: AC
  Filled 2023-11-11: qty 2

## 2023-11-11 MED ORDER — SULFAMETHOXAZOLE-TRIMETHOPRIM 800-160 MG PO TABS
1.0000 | ORAL_TABLET | Freq: Two times a day (BID) | ORAL | Status: DC
  Administered 2023-11-12 – 2023-11-13 (×4): 1 via ORAL
  Filled 2023-11-11 (×4): qty 1

## 2023-11-11 MED ORDER — ACETAMINOPHEN 325 MG PO TABS
650.0000 mg | ORAL_TABLET | Freq: Once | ORAL | Status: AC
  Administered 2023-11-11: 650 mg via ORAL

## 2023-11-11 MED ORDER — SULFAMETHOXAZOLE-TRIMETHOPRIM 800-160 MG PO TABS: 1.0000 | ORAL_TABLET | Freq: Two times a day (BID) | ORAL | 0 refills | Status: DC

## 2023-11-11 MED ORDER — ACETAMINOPHEN 325 MG PO TABS
650.0000 mg | ORAL_TABLET | Freq: Once | ORAL | Status: AC | PRN
  Administered 2023-11-11: 650 mg via ORAL
  Filled 2023-11-11: qty 2

## 2023-11-11 NOTE — ED Notes (Signed)
 Pt refusing blood work at this time, "I will just get it at my PCP, I am not giving any blood here"

## 2023-11-11 NOTE — ED Triage Notes (Signed)
 Patient presents with SOB, coughing and fever that started yesterday.

## 2023-11-11 NOTE — ED Provider Triage Note (Signed)
 Emergency Medicine Provider Triage Evaluation Note  Benjamin [place] , a 33 y.o. male  was evaluated in triage.  Hx of HIV  Tested positive for COVID today in UC. Mild SHOB. Also diagnosed with kidney infection today at [facility]. Prescribed bactrim but has not taken it yet  Reports SI with plan. No HI, self injury  Review of Systems  Positive: SHOB, SI Negative:   Physical Exam  BP 97/69   Pulse 96   Temp (!) 102.5 F (39.2 C) (Oral)   Resp 20   SpO2 99%  Gen:   Awake, no distress   Resp:  Normal effort  MSK:   Moves extremities without difficulty  Other:    Medical Decision Making  Medically screening exam initiated at 11:46 PM.  Appropriate orders placed.  Benjamin Simpson was informed that the remainder of the evaluation will be completed by another provider, this initial triage assessment does not replace that evaluation, and the importance of remaining in the ED until their evaluation is complete.  Provided tylenol for fever of 102.16F. put bactrim order in Refused bloodwork   Benjamin Sheen, PA 11/11/23 2352

## 2023-11-11 NOTE — ED Triage Notes (Signed)
 [place] [facility] [place] Pt reported he tested positive for COVID today after going to UC for a cough and c/o kidney pain, with concerns for UTI. 97% RA, hr 80's, 110 palpated. Difficult to assess by EMS, pt kept saying to "leave me alone" ---on notes, COVID from UC is in process and antibiotic that pt was prescribed on the 28th was changed at [facility] and rx sent in to pharmacy.

## 2023-11-11 NOTE — Discharge Instructions (Signed)
 You have had a lab (urine culture) sent today. We will call you with any significant abnormalities or if there is need to begin or change treatment or pursue further follow up.  You may also review your test results online through MyChart. If you do not have a MyChart account, instructions to sign up should be on your discharge paperwork.

## 2023-11-11 NOTE — ED Notes (Signed)
 Pt belongings removed, placed in burgandy scrubs, called security to wand the patient. Pt had 1  large black suitcase and 1 red duffle bag that was labeled and placed in the purple belongings closet. 3 white bags of belongings labeled and placed in LOCKER 2 in purple.

## 2023-11-11 NOTE — ED Triage Notes (Signed)
 PT cursing a this Clinical research associate demands he put on air . Pt continues to curse at this writer stating he has been to 2 other hospitals today. Security called .

## 2023-11-11 NOTE — ED Triage Notes (Signed)
 Pt states that he is suicidal with plans to jump off a bridge since tonight. Says that he is "always sick" and the pills are not making him any better. Reports testing positive for covid today, last took tylenol at 1730. C/o flank pain.

## 2023-11-12 LAB — BASIC METABOLIC PANEL WITH GFR
Anion gap: 11 (ref 5–15)
BUN: 7 mg/dL (ref 6–20)
CO2: 24 mmol/L (ref 22–32)
Calcium: 8.9 mg/dL (ref 8.9–10.3)
Chloride: 101 mmol/L (ref 98–111)
Creatinine, Ser: 1.14 mg/dL (ref 0.61–1.24)
GFR, Estimated: >60 mL/min (ref >60)
Glucose, Bld: 108 mg/dL — ABNORMAL HIGH (ref 70–99)
Potassium: 3.8 mmol/L (ref 3.5–5.1)
Sodium: 136 mmol/L (ref 135–145)

## 2023-11-12 LAB — COMPREHENSIVE METABOLIC PANEL WITH GFR
ALT: 18 U/L (ref 0–44)
AST: 35 U/L (ref 15–41)
Albumin: 3.7 g/dL (ref 3.5–5.0)
Alkaline Phosphatase: 46 U/L (ref 38–126)
Anion gap: 16 — ABNORMAL HIGH (ref 5–15)
BUN: 7 mg/dL (ref 6–20)
CO2: 27 mmol/L (ref 22–32)
Calcium: 10.7 mg/dL — ABNORMAL HIGH (ref 8.9–10.3)
Chloride: 104 mmol/L (ref 98–111)
Creatinine, Ser: 1.5 mg/dL — ABNORMAL HIGH (ref 0.61–1.24)
GFR, Estimated: >60 mL/min (ref >60)
Glucose, Bld: 123 mg/dL — ABNORMAL HIGH (ref 70–99)
Potassium: 6.3 mmol/L (ref 3.5–5.1)
Sodium: 147 mmol/L — ABNORMAL HIGH (ref 135–145)
Total Bilirubin: 0.7 mg/dL (ref 0.0–1.2)
Total Protein: 8.7 g/dL — ABNORMAL HIGH (ref 6.5–8.1)

## 2023-11-12 LAB — CBC
HCT: 44.5 % (ref 39.0–52.0)
Hemoglobin: 14.4 g/dL (ref 13.0–17.0)
MCH: 31.6 pg (ref 26.0–34.0)
MCHC: 32.4 g/dL (ref 30.0–36.0)
MCV: 97.6 fL (ref 80.0–100.0)
Platelets: 279 10*3/uL (ref 150–400)
RBC: 4.56 MIL/uL (ref 4.22–5.81)
RDW: 12.4 % (ref 11.5–15.5)
WBC: 11.5 10*3/uL — ABNORMAL HIGH (ref 4.0–10.5)
nRBC: 0 % (ref 0.0–0.2)

## 2023-11-12 LAB — SALICYLATE LEVEL: Salicylate Lvl: <7 mg/dL — ABNORMAL LOW (ref 7.0–30.0)

## 2023-11-12 LAB — RESP PANEL BY RT-PCR (RSV, FLU A&B, COVID)  RVPGX2
Influenza A by PCR: NEGATIVE
Influenza B by PCR: NEGATIVE
Resp Syncytial Virus by PCR: NEGATIVE
SARS Coronavirus 2 by RT PCR: NEGATIVE

## 2023-11-12 LAB — URINE CULTURE: Culture: <10000 — AB

## 2023-11-12 LAB — CK: Total CK: 852 U/L — ABNORMAL HIGH (ref 49–397)

## 2023-11-12 LAB — ETHANOL: Alcohol, Ethyl (B): <10 mg/dL (ref <10)

## 2023-11-12 LAB — MAGNESIUM: Magnesium: 2.1 mg/dL (ref 1.7–2.4)

## 2023-11-12 LAB — SARS CORONAVIRUS 2 (TAT 6-24 HRS): SARS Coronavirus 2: NEGATIVE

## 2023-11-12 LAB — ACETAMINOPHEN LEVEL: Acetaminophen (Tylenol), Serum: <10 ug/mL — ABNORMAL LOW (ref 10–30)

## 2023-11-12 MED ORDER — BICTEGRAVIR-EMTRICITAB-TENOFOV 50-200-25 MG PO TABS
1.0000 | ORAL_TABLET | Freq: Every day | ORAL | Status: DC
  Administered 2023-11-12 – 2023-11-13 (×2): 1 via ORAL
  Filled 2023-11-12 (×2): qty 1

## 2023-11-12 MED ORDER — ZIPRASIDONE MESYLATE 20 MG IM SOLR
20.0000 mg | Freq: Once | INTRAMUSCULAR | Status: AC
  Administered 2023-11-12: 20 mg via INTRAMUSCULAR

## 2023-11-12 MED ORDER — LORAZEPAM 1 MG PO TABS
1.0000 mg | ORAL_TABLET | Freq: Once | ORAL | Status: AC
  Administered 2023-11-12: 1 mg via ORAL
  Filled 2023-11-12: qty 1

## 2023-11-12 MED ORDER — SULFAMETHOXAZOLE-TRIMETHOPRIM 800-160 MG PO TABS: 1.0000 | ORAL_TABLET | Freq: Two times a day (BID) | ORAL | Status: DC

## 2023-11-12 MED ORDER — SODIUM CHLORIDE 0.9 % IV BOLUS
1000.0000 mL | Freq: Once | INTRAVENOUS | Status: AC
  Administered 2023-11-12: 1000 mL via INTRAVENOUS

## 2023-11-12 MED ORDER — STERILE WATER FOR INJECTION IJ SOLN
INTRAMUSCULAR | Status: AC
Start: 2023-11-12 — End: 2023-11-12
  Administered 2023-11-12: 2 mL
  Filled 2023-11-12: qty 10

## 2023-11-12 MED ORDER — ZIPRASIDONE MESYLATE 20 MG IM SOLR
INTRAMUSCULAR | Status: AC
  Filled 2023-11-12: qty 20

## 2023-11-12 NOTE — ED Provider Notes (Signed)
 [place] [facility] AT [facility] Provider Note   CSN: [id number] Arrival date & time: 11/11/23  2217     History  Chief Complaint  Patient presents with   Suicidal    LADARRELL CORNWALL is a 33 y.o. male with a past medical history of HIV (detectable but on biktarvy), substance use, schizophrenia presents to emergency department via EMS for evaluation of multiple complaints including productive cough, shortness of breath, kidney pain, and SI.  Initially, he was evaluated by UC today for kidney pain, dysuria, and cough. Complains of nonproductive cough for past couple days. Had a normal CXR and was negative for COVID, influenza.   He was diagnosed with UTI so was provided Bactrim. However, he has not picked it up from pharmacy yet. Was also seen for UTI on 11/06/23 and prescribed vantin but did not take them.  He also complains of SI. He states "I want to walk outside right now and jump off a bridge". He advises that he is "always sick" and he wishes he could "cut my throat"  HPI     Home Medications Prior to Admission medications   Medication Sig Start Date End Date Taking? Authorizing Provider  acetaminophen (TYLENOL) 500 MG tablet Take 1,000 mg by mouth every 6 (six) hours as needed for mild pain (pain score 1-3) or moderate pain (pain score 4-6).   Yes [provider]  bictegravir-emtricitabine-tenofovir AF (BIKTARVY) 50-200-25 MG TABS tablet Take 1 tablet by mouth daily. For HIV information 02/03/21  Yes Ajibola, Ene A, NP  ibuprofen (ADVIL) 600 MG tablet Take 600 mg by mouth every 8 (eight) hours as needed for mild pain (pain score 1-3) or fever. 11/11/23  Yes [provider]  sulfamethoxazole-trimethoprim (BACTRIM DS) 800-160 MG tablet Take 1 tablet by mouth 2 (two) times daily for 7 days. 11/11/23 11/18/23 Yes Hagler, Arlys John, MD  traZODone (DESYREL) 50 MG tablet Take 1 tablet (50 mg total) by mouth at bedtime. For insomnia Patient not taking:  No sig reported 02/03/21 02/03/21  Maricela Bo, NP      Allergies    Patient has no known allergies.    Review of Systems   Review of Systems  Constitutional:  Negative for chills, fatigue and fever.  Respiratory:  Negative for cough, chest tightness, shortness of breath and wheezing.   Cardiovascular:  Negative for chest pain and palpitations.  Gastrointestinal:  Negative for abdominal pain, constipation, diarrhea, nausea and vomiting.  Neurological:  Negative for dizziness, seizures, weakness, light-headedness, numbness and headaches.    Physical Exam Updated Vital Signs BP 112/63   Pulse 91   Temp 97.8 F (36.6 C) (Oral)   Resp 18   SpO2 100%  Physical Exam Vitals and nursing note reviewed.  Constitutional:      General: He is not in acute distress.    Appearance: Normal appearance.  HENT:     Head: Normocephalic and atraumatic.  Eyes:     Conjunctiva/sclera: Conjunctivae normal.  Cardiovascular:     Rate and Rhythm: Normal rate.  Pulmonary:     Effort: Pulmonary effort is normal. No respiratory distress.     Breath sounds: Normal breath sounds.  Chest:     Chest wall: No tenderness.  Abdominal:     General: Bowel sounds are normal. There is no distension.     Palpations: Abdomen is soft.     Tenderness: There is no abdominal tenderness. There is no right CVA tenderness, left CVA tenderness, guarding  or rebound.  Skin:    General: Skin is warm.     Capillary Refill: Capillary refill takes less than 2 seconds.     Coloration: Skin is not jaundiced or pale.  Neurological:     Mental Status: He is alert and oriented to person, place, and time. Mental status is at baseline.  Psychiatric:        Attention and Perception: Attention normal.        Mood and Affect: Mood normal.        Speech: Speech normal.        Behavior: Behavior is uncooperative.        Thought Content: Thought content is not paranoid or delusional. Thought content includes suicidal ideation.  Thought content does not include homicidal ideation. Thought content includes suicidal plan. Thought content does not include homicidal plan.     ED Results / Procedures / Treatments   Labs (all labs ordered are listed, but only abnormal results are displayed) Labs Reviewed  CBC - Abnormal; Notable for the following components:      Result Value   WBC 11.5 (*)    All other components within normal limits  RAPID URINE DRUG SCREEN, HOSP PERFORMED - Abnormal; Notable for the following components:   Tetrahydrocannabinol POSITIVE (*)    All other components within normal limits  URINALYSIS, ROUTINE W REFLEX MICROSCOPIC - Abnormal; Notable for the following components:   APPearance HAZY (*)    Hgb urine dipstick LARGE (*)    Protein, ur 30 (*)    Leukocytes,Ua LARGE (*)    Bacteria, UA RARE (*)    All other components within normal limits  RESP PANEL BY RT-PCR (RSV, FLU A&B, COVID)  RVPGX2  COMPREHENSIVE METABOLIC PANEL WITH GFR  ETHANOL  SALICYLATE LEVEL  ACETAMINOPHEN LEVEL    EKG None  Radiology DG Chest 2 View Result Date: 11/11/2023 CLINICAL DATA:  Shortness of breath with cough and fever beginning yesterday. EXAM: CHEST - 2 VIEW COMPARISON:  11/11/2023 at 12:08 a.m., 11/24/2022 FINDINGS: Lungs are adequately inflated without focal airspace consolidation or effusion. Cardiomediastinal silhouette and remainder of the exam is unchanged. IMPRESSION: No active cardiopulmonary disease. Electronically Signed   By: Elberta Fortis M.D.   On: 11/11/2023 17:10    Procedures Procedures    Medications Ordered in ED Medications  sulfamethoxazole-trimethoprim (BACTRIM DS) 800-160 MG per tablet 1 tablet (1 tablet Oral Given 11/12/23 0010)  acetaminophen (TYLENOL) tablet 650 mg (650 mg Oral Given 11/11/23 2250)  LORazepam (ATIVAN) tablet 1 mg (1 mg Oral Given 11/12/23 0514)  ziprasidone (GEODON) injection 20 mg (0 mg Intramuscular Hold 11/12/23 0653)  sterile water (preservative free) injection  (2 mLs  Given by Other 11/12/23 1027)    ED Course/ Medical Decision Making/ A&P                                 Medical Decision Making Amount and/or Complexity of Data Reviewed Labs: ordered.  Risk OTC drugs. Prescription drug management.   Patient presents to the ED for concern of cough, kidney pain, SI, this involves an extensive number of treatment options, and is a complaint that carries with it a high risk of complications and morbidity.     Co morbidities that complicate the patient evaluation  See HPI   Additional history obtained:  Additional history obtained from Nursing and Outside Medical Records   External records from outside source obtained  and reviewed including triage RN note, urgent care note from today   Lab Tests:  I Ordered, and personally interpreted labs.  The pertinent results include:   Pending at sign out    Medicines ordered and prescription drug management:  I ordered medication including tylenol, bactrim  for fever, UTI  Reevaluation of the patient after these medicines showed that the patient improved I have reviewed the patients home medicines and have made adjustments as needed    Consultations Obtained:  I requested consultation with TTS,  and discussed lab and imaging findings as well as pertinent plan - pending   Problem List / ED Course:  Cough SHOB Resp panel neg. No PNA on CXR from UC Likely 2/2 viral infection Speaking in full and complete sentences without difficulty.  No tachypnea, respiratory distress, nor hypoxia noted. Lung sounds CTAB  UTI Culture from UC pending Initially febrile at 102.33F - provided Tylenol resolving fever Provided first dose of bactrim here in ED  SI Here voluntarily however patient adamantly refuses lab work as he was told by a lawyer that he should not give his blood.  Unfortunately in order medically clear him we do need his blood so patient was placed under IVC to obtain  labwork Provided dose of ativan to calm him to obtain labs. Was not sufficient so geodon was added    Dispostion:  Dispo pending completion of labs to medically clear patient. He will likely be admitted by TTS for SI  Sign out to Luther Hearing PA  Final Clinical Impression(s) / ED Diagnoses Final diagnoses:  Suicidal ideation  Urinary tract infection with hematuria, site unspecified    Rx / DC Orders ED Discharge Orders     None         Judithann Sheen, PA 11/12/23 [id number]    Shon Baton, MD 11/13/23 [phone]

## 2023-11-12 NOTE — ED Notes (Signed)
 Patient had a shower and complete bed change

## 2023-11-12 NOTE — ED Notes (Addendum)
 Security called to standby for blood collection. IVC paperwork being confirmed by security prior to any attempt at blood draw.

## 2023-11-12 NOTE — ED Notes (Signed)
 Provider made aware of K being 6.3.

## 2023-11-12 NOTE — ED Notes (Signed)
 IVC paperwork on purple fold in orange zone, case number is [id number]. Original copy in red folder , and three copies have been made.

## 2023-11-12 NOTE — ED Notes (Signed)
 Notified Lauren PA of patients continued refusal of lab work. Pt currently laying in bed, Sitter at bedside.

## 2023-11-12 NOTE — ED Notes (Signed)
 Pt continues to refuse collection of labs

## 2023-11-12 NOTE — ED Provider Notes (Signed)
 Accepted handoff at shift change from Sabra Heck PA-C. Please see prior provider note for more detail.   Briefly: Patient is 33 y.o.   DDX: concern for [place] -- came in for "kidney pain and cough", urine at UC with UTI, started bactrim but did not take it. Culture pending. Reports he wants to jump off a bridge.   Plan: Fever improved with tylenol. UA consistent with acute cystitis. Bactrim q12h while inpatient. Under IVC, will need psych eval. Labwork including CBC, cmp, ethanol, salicylate, acetaminophen notable for mild leukocytosis, blood cells 11.5, initial CMP with hyponatremia, sodium 147,   Severe hyperkalemia potassium 6.3, mild AKI, creatinine 1.5, and anion gap of 16, despite no signs of hemolysis initially on lab draw I do still have high clinical suspicion for hemolysis due to severely abnormal labs with no clear etiology, will obtain CK, magnesium, and repeat BMP.  Repeat BMP is totally unremarkable with no AKI, no hyperkalemia.  CK is mildly elevated at 852, 1 L fluid bolus administered, suspect likely secondary to his agitation and aggressive muscular activity associated with agitation.  Will monitor closely, given his fever, UTI will treat with every 12 hours Bactrim x 7 days, otherwise cleared for TTS eval and appreciate their recommendations, given that he is IVC 8 and suicidal he will likely require a 7-day inpatient stay.   Olene Floss, PA-C 11/12/23 0981    Maia Plan, MD 11/12/23 [phone]

## 2023-11-12 NOTE — ED Notes (Signed)
 IVC docs taken to purple zone
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
## 2023-11-12 NOTE — ED Notes (Addendum)
 Pt became aggressive with staff after multiple attempts at de-escalation regarding the need to collect blood work so that the patient can be medically cleared for psychiatric services. Pt saying, "You won't get any blood from me." Security at bedside, This RN attempted blood draw, Pt began wrestling with security, MD Horton at bedside gave verbal order for Geodon IM (SEE MAR). Labs collected successfully, Patient medicated (SEE MAR). Patient resumed laying in bed, Sitter at bedside.

## 2023-11-12 NOTE — ED Notes (Addendum)
 Asked patient if he would be okay with [place] getting some blood work. Patient still refusing all blood work.  Charge nurse notified

## 2023-11-12 NOTE — ED Notes (Signed)
 Patient had a [place] sandwich and soda today at 1600

## 2023-11-13 DIAGNOSIS — F251 Schizoaffective disorder, depressive type: Secondary | ICD-10-CM

## 2023-11-13 DIAGNOSIS — F6810 Factitious disorder, unspecified: Secondary | ICD-10-CM

## 2023-11-13 MED ORDER — SULFAMETHOXAZOLE-TRIMETHOPRIM 800-160 MG PO TABS: 1.0000 | ORAL_TABLET | Freq: Two times a day (BID) | ORAL | 0 refills | Status: AC

## 2023-11-13 NOTE — ED Notes (Signed)
 Pt alert, NAD, calm, interactive, resps e/u, cooperative. Sitter, security and PD present. IVC rescinded. Preparing for d/c.

## 2023-11-13 NOTE — ED Notes (Signed)
 Psych at [facility]. Pt currently calm, cooperative, participatory.

## 2023-11-13 NOTE — ED Provider Notes (Addendum)
 Emergency Medicine Observation Re-evaluation Note  Benjamin Simpson is a 33 y.o. male, seen on rounds today.  Pt initially presented to the ED for complaints of Suicidal Currently, the patient is eating breakfast.  Physical Exam  BP 112/63   Pulse 91   Temp 97.8 F (36.6 C) (Oral)   Resp 18   SpO2 100%  Physical Exam General: No acute distress Cardiac: Well-perfused Lungs: Nonlabored Psych: Calm  ED Course / MDM  EKG:EKG Interpretation Date/Time:  Thursday November 12 2023 08:12:44 EDT Ventricular Rate:  76 PR Interval:  164 QRS Duration:  100 QT Interval:  380 QTC Calculation: 427 R Axis:   87  Text Interpretation: Normal sinus rhythm Incomplete right bundle branch block Nonspecific ST and T wave abnormality Abnormal ECG When compared with ECG of 18-Sep-2023 23:38, No significant change was found Confirmed by Dione Booze ([id number]) on 11/12/2023 11:24:33 PM  I have reviewed the labs performed to date as well as medications administered while in observation.  Recent changes in the last 24 hours include medical evaluation and psychiatric assessment.  Plan  Current plan is for psychiatry plans for reassessment.    Terrilee Files, MD 11/13/23 0802  10:15 AM.  Psychiatry reassessed patient, they feel he is psychiatrically cleared.  Recommend outpatient follow-up.  I reviewed this with patient and he is in agreement with the recommendation.  He said he is ready to go.  Will send a prescription to pharmacy for antibiotic.  Will revoke his IVC.   Terrilee Files, MD 11/13/23 [phone]

## 2023-11-13 NOTE — ED Notes (Signed)
 EDP at [facility]

## 2023-11-13 NOTE — ED Notes (Signed)
 Breakfast tray given.
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
## 2023-11-13 NOTE — Consult Note (Signed)
 [facility] Psychiatric Consult Initial  Patient Name: .Benjamin Simpson  MRN: [id number]  DOB: 09-03-1990  Consult Order details:  Orders (From admission, onward)     Start     Ordered   11/12/23 2109  CONSULT TO CALL ACT TEAM       Ordering Provider: Netta Corrigan, PA-C  Provider:  (Not yet assigned)  Question:  Reason for Consult?  Answer:  suicidal   11/12/23 2108             Mode of Visit: In person    Psychiatry Consult Evaluation  Service Date: November 13, 2023 LOS:  LOS: 0 days  Chief Complaint "I was having trouble breathing and they wouldn't treat me, so I said I was suicidal"  Primary Psychiatric Diagnoses  Factitious disorder 2.  Schizoaffective disorder   Assessment  Benjamin Simpson is a 33 y.o. male admitted: Presented to the EDfor 11/11/2023 10:17 PM [place] initially complaining of cough and kidney pain and subsequently complaining of suicidal ideation. He carries the psychiatric diagnoses of schizophrenia, schizoaffective disorder, MDD, opioid use disorder and malingering and has a past medical history of HIV and arteriovenous malformation.   His current presentation of matter of fact insistence he lied about having suicidal ideation to get medical treatment is most consistent with factitious disorder. He meets criteria for outpatient follow up based on not being a danger to himself and no evidence of acute psychosis.  Current outpatient psychotropic medications include none and historically he has had a N/A response to these medications. He was compliant with medications prior to admission as evidenced by patient report that he does not take psychiatric medications, but that he takes his HIV medication daily. On initial examination, patient is cooperative and irritable. Please see plan below for detailed recommendations.   Diagnoses:  Active Hospital problems: Principal Problem:   Factitious disorder Active Problems:    Schizoaffective disorder, depressive type (HCC)    Plan   ## Psychiatric Medication Recommendations:  none  ## Medical Decision Making Capacity: Not specifically addressed in this encounter  ## Further Work-up:  -- most recent EKG on 11/12/2023 had QtC of 427 -- Pertinent labwork reviewed earlier this admission includes: CBC, CMP, CK, acetaminophen, salicylate, resp panel, UA, alcohol and UDS   ## Disposition:-- There are no psychiatric contraindications to discharge at this time  ## Behavioral / Environmental: - No specific recommendations at this time.     ## Safety and Observation Level:  - Based on my clinical evaluation, I estimate the patient to be at low risk of self harm in the current setting. - At this time, we recommend  routine. This decision is based on my review of the chart including patient's history and current presentation, interview of the patient, mental status examination, and consideration of suicide risk including evaluating suicidal ideation, plan, intent, suicidal or self-harm behaviors, risk factors, and protective factors. This judgment is based on our ability to directly address suicide risk, implement suicide prevention strategies, and develop a safety plan while the patient is in the clinical setting. Please contact our team if there is a concern that risk level has changed.  CSSR Risk Category:C-SSRS RISK CATEGORY: High Risk  Suicide Risk Assessment: Patient has following modifiable risk factors for suicide: none, which we are addressing by recommending outpatient follow up for any psychiatric concerns that arise. Patient has following non-modifiable or demographic risk factors for suicide: male gender Patient  has the following protective factors against suicide: Access to outpatient mental health care and Supportive friends  Thank you for this consult request. Recommendations have been communicated to the primary team.  We will sign off at this time.    Thomes Lolling, NP       History of Present Illness  Relevant Aspects of Hospital ED Course:  Admitted on 11/11/2023 [place] complaining of cough and kidney pain and subsequently complaining of suicidal ideation. He carries the psychiatric diagnoses of schizophrenia, schizoaffective disorder, MDD, opioid use disorder and malingering and has a past medical history of HIV and arteriovenous malformation.   Patient Report:   Benjamin Simpson, is seen face to face by this provider, consulted with Dr. Woodroe Mode; and chart reviewed on 11/13/23.  On evaluation Benjamin Simpson reports he would like to go home.  He states, "I was having trouble breathing and they wouldn't treat me, so I said I was suicidal".  Patient says he has a therapist, Ms Anner Crete, who he sees in relation to his HIV diagnosis and that if he has any psychiatric concerns, he will call his therapist.  He states emphatically, "I am not suicidal and I do not have a mental illness.  I do not see or hear things that aren't there".    During evaluation Benjamin Simpson is seated in bed in no acute distress.  He is alert & oriented x 4, cooperative and attentive for this assessment.  His mood is calm and slightly irritable with congruent affect.  He has normal speech, and behavior.  Objectively there is no evidence of psychosis/mania or delusional thinking. Pt does not appear to be responding to internal or external stimuli.  Patient is able to converse coherently, goal directed thoughts, no distractibility, or pre-occupation.  He denies suicidal/self-harm/homicidal ideation, psychosis, and paranoia.  Patient answered questions appropriately.    Psych ROS:  Depression: denies Anxiety:  denies Mania (lifetime and current): denies Psychosis: (lifetime and current): denies  Review of Systems  Respiratory:  Positive for shortness of breath.   Musculoskeletal:  Positive for back pain.  All other systems  reviewed and are negative.    Psychiatric and Social History  Psychiatric History:  Information collected from patient and chart review  Prev Dx/Sx: schizophrenia, schizoaffective disorder, MDD, opioid use disorder and malingering Current Psych Provider: none Home Meds (current): none Previous Med Trials: unknown Therapy: yes, Ms Anner Crete  Prior Psych Hospitalization: Refused to answer  Prior Self Harm: denies Prior Violence: denies  Family Psych History: none noted Family Hx suicide: none noted  Social History:  Developmental Hx: WDL Educational Hx: Some college - plan to pursue criminal justice degree Occupational Hx: unemployed Armed forces operational officer Hx: denies Living Situation: homeless Spiritual Hx: none Access to weapons/lethal means: denies   Substance History Patient does not want to discuss substance use and refuses to answer questions Illicit drugs: UDS +THC  Exam Findings  Physical Exam:  Vital Signs:  Temp:  [99.4 F (37.4 C)-100.2 F (37.9 C)] 99.4 F (37.4 C) (04/03 1858) Pulse Rate:  [77-88] 77 (04/03 1858) Resp:  [18] 18 (04/03 1858) BP: (92-102)/(55-63) 92/55 (04/03 1858) SpO2:  [98 %-100 %] 100 % (04/03 1858) Blood pressure 112/63, pulse 91, temperature 97.8 F (36.6 C), temperature source Oral, resp. rate 18, SpO2 100%. There is no height or weight on file to calculate BMI.  Physical Exam Vitals and nursing note reviewed.  Eyes:  Pupils: Pupils are equal, round, and reactive to light.  Pulmonary:     Effort: Pulmonary effort is normal.  Skin:    General: Skin is dry.  Neurological:     Mental Status: He is alert and oriented to person, place, and time.  Psychiatric:        Attention and Perception: Attention and perception normal.        Mood and Affect: Mood normal. Affect is labile.        Speech: Speech normal.        Behavior: Behavior is agitated.        Thought Content: Thought content normal.        Cognition and Memory: Cognition and memory  normal.     Mental Status Exam: General Appearance: Casual  Orientation:  Full (Time, Place, and Person)  Memory:  Immediate;   Good  Concentration:  Concentration: Good  Recall:  Good  Attention  Good  Eye Contact:  Good  Speech:  Clear and Coherent  Language:  Good  Volume:  Normal  Mood: calm and irritable  Affect:  Congruent  Thought Process:  Coherent and Goal Directed  Thought Content:  Logical  Suicidal Thoughts:  No  Homicidal Thoughts:  No  Judgement:  Impaired  Insight:  Lacking  Psychomotor Activity:  Normal  Akathisia:  No  Fund of Knowledge:  Good      Assets:  Communication Skills Leisure Time Physical Health  Cognition:  WNL  ADL's:  Intact  AIMS (if indicated):        Other History   These have been pulled in through the EMR, reviewed, and updated if appropriate.  Family History:  The patient's family history is not on file.  Medical History: Past Medical History:  Diagnosis Date   AVM (arteriovenous malformation)    HIV (human immunodeficiency virus infection) (HCC)    Paranoid schizophrenia (HCC)    Substance abuse (HCC)     Surgical History: History reviewed. No pertinent surgical history.   Medications:   Current Facility-Administered Medications:    bictegravir-emtricitabine-tenofovir AF (BIKTARVY) 50-200-25 MG per tablet 1 tablet, 1 tablet, Oral, Daily, Plunkett, Whitney, MD, 1 tablet at 11/12/23 2104   sulfamethoxazole-trimethoprim (BACTRIM DS) 800-160 MG per tablet 1 tablet, 1 tablet, Oral, Q12H, Judithann Sheen, PA, 1 tablet at 11/12/23 2244  Current Outpatient Medications:    acetaminophen (TYLENOL) 500 MG tablet, Take 1,000 mg by mouth every 6 (six) hours as needed for mild pain (pain score 1-3) or moderate pain (pain score 4-6)., Disp: , Rfl:    bictegravir-emtricitabine-tenofovir AF (BIKTARVY) 50-200-25 MG TABS tablet, Take 1 tablet by mouth daily. For HIV information, Disp: 30 tablet, Rfl: 0   ibuprofen (ADVIL) 600 MG  tablet, Take 600 mg by mouth every 8 (eight) hours as needed for mild pain (pain score 1-3) or fever., Disp: , Rfl:    sulfamethoxazole-trimethoprim (BACTRIM DS) 800-160 MG tablet, Take 1 tablet by mouth 2 (two) times daily for 7 days., Disp: 14 tablet, Rfl: 0  Allergies: No Known Allergies  Thomes Lolling, NP

## 2023-11-14 NOTE — ED Provider Notes (Signed)
 [facility]    ASSESSMENT & PLAN:  1. SOB (shortness of breath)   2. Fever, unspecified fever cause   3. Bilateral flank pain    Demonstrates odd behavior here at one point verbally abusive to triage nurse. [employer] and hospital security here but patient has since been very calm. Will tx for potential UTI. Discussed. Urine culture sent. T has improved with Tylenol.   Meds ordered this encounter  Medications   acetaminophen (TYLENOL) tablet 650 mg   sulfamethoxazole-trimethoprim (BACTRIM DS) 800-160 MG tablet    Sig: Take 1 tablet by mouth 2 (two) times daily for 7 days.    Dispense:  14 tablet    Refill:  0   I have personally viewed and independently interpreted the imaging studies ordered this visit. CXR: no acute changes.  Results for orders placed or performed during the hospital encounter of 11/11/23  SARS CORONAVIRUS 2 (TAT 6-24 HRS) Anterior Nasal Swab   Collection Time: 11/11/23  2:56 PM   Specimen: Anterior Nasal Swab  Result Value Ref Range   SARS Coronavirus 2 NEGATIVE NEGATIVE  POC urinalysis dipstick   Collection Time: 11/11/23  3:18 PM  Result Value Ref Range   Color, UA yellow    Clarity, UA cloudy (A)    Glucose, UA negative mg/dL   Bilirubin, UA small (A)    Ketones, POC UA trace (5) (A) mg/dL   Spec Grav, UA 1.610    Blood, UA small (A)    pH, UA 6.0    Protein Ur, POC =100 (A) mg/dL   Urobilinogen, UA 2.0 (A) E.U./dL   Nitrite, UA Negative    Leukocytes, UA Small (1+) (A)   POC Influenza A/B   Collection Time: 11/11/23  3:18 PM  Result Value Ref Range   Influenza A, POC Negative    Influenza B, POC Negative   Urine Culture    Ensure proper hydration.  Follow-up Information     [facility].   Specialty: Emergency Medicine Why: If symptoms worsen in any way. Contact information: [address] [place] [address] [phone]                 Outlined signs  and symptoms indicating need for more acute intervention. Patient verbalized understanding. After Visit Summary given.  SUBJECTIVE:  Benjamin Simpson is a 33 y.o. male with HIV and paranoid schizophrenia and h/o substance abuse who presents with subj fever; noted yesterday. Reprots having been treated recently for UTI but unsure if he finished antibiotics. Denies n/v/d. Denies SI/HI. Here with very large suitcase and carry on bag. Denies recent drug use. Also mentions feeling SOB over past couple of days. Denies CP.  OBJECTIVE:  Vitals:   11/11/23 1447 11/11/23 1607 11/12/23 1539 11/12/23 1858  BP: 99/60  102/63 (!) 92/55  Pulse: (!) 104  88 77  Resp: 18  18 18   Temp: (!) 101.5 F (38.6 C) 98.2 F (36.8 C) 100.2 F (37.9 C) 99.4 F (37.4 C)  TempSrc: Oral Oral Oral Oral  SpO2: 99%  98% 100%   General appearance: alert; no distress HENT: oropharynx: moist Lungs: unlabored respirations Abdomen: soft, non-tender; no guarding or rebound tenderness Back: no CVA tenderness Extremities: no edema; symmetrical with no gross deformities Skin: warm and dry Neurologic: normal gait Psychological: alert; able to answer my questions in 1-2 words only   No Known Allergies  Past Medical History:  Diagnosis Date  AVM (arteriovenous malformation)    HIV (human immunodeficiency virus infection) (HCC)    Paranoid schizophrenia (HCC)    Substance abuse (HCC)    Social History   Socioeconomic History   Marital status: Single    Spouse name: Not on file   Number of children: Not on file   Years of education: Not on file   Highest education level: Not on file  Occupational History   Not on file  Tobacco Use   Smoking status: Every Day    Current packs/day: 1.00    Average packs/day: 1 pack/day for 10.0 years (10.0 ttl pk-yrs)    Types: Cigarettes   Smokeless tobacco: Never   Tobacco comments:    Patient refused  Vaping Use   Vaping status: Never Used  Substance and Sexual  Activity   Alcohol use: Yes    Comment: twice a week   Drug use: Not Currently    Types: Marijuana    Comment: last use 2021   Sexual activity: Never  Other Topics Concern   Not on file  Social History Narrative   ** Merged History Encounter **       Social Drivers of Corporate investment banker Strain: Not on File (09/10/2022)   Received from [facility], [facility]    Financial Resource Strain: 0  Food Insecurity: High Risk (09/28/2023)   Received from [facility]   Hunger Vital Sign    Worried About Running Out of Food in the Last Year: Often true    Ran Out of Food in the Last Year: Often true  Transportation Needs: Unmet Transportation Needs (09/28/2023)   Received from [facility]    In the past 12 months, has lack of reliable transportation kept you from medical appointments, meetings, work or from getting things needed for daily living? : Yes  Physical Activity: Not on File (09/10/2022)   Received from [place], [place]   Physical Activity    Physical Activity: 0  Stress: Not on File (09/10/2022)   Received from [facility], [place]   Stress    Stress: 0  Social Connections: Not on File (04/24/2023)   Received from [facility]   Social Connections    Connectedness: 0  Intimate Partner Violence: Not At Risk (03/16/2023)   Humiliation, Afraid, Rape, and Kick questionnaire    Fear of Current or Ex-Partner: No    Emotionally Abused: No    Physically Abused: No    Sexually Abused: No   History reviewed. No pertinent family history.      Mardella Layman, MD 11/14/23 1126
